# Patient Record
Sex: Female | Born: 1990 | Race: Black or African American | Hispanic: No | Marital: Single | Smoking: Current some day smoker
Health system: Southern US, Community
[De-identification: ages and names within clinical notes are randomized; demographics above are authoritative.]

## PROBLEM LIST (undated history)

## (undated) DIAGNOSIS — R109 Unspecified abdominal pain: Secondary | ICD-10-CM

## (undated) DIAGNOSIS — Z765 Malingerer [conscious simulation]: Secondary | ICD-10-CM

## (undated) DIAGNOSIS — G8929 Other chronic pain: Secondary | ICD-10-CM

## (undated) DIAGNOSIS — J45909 Unspecified asthma, uncomplicated: Secondary | ICD-10-CM

## (undated) DIAGNOSIS — K8020 Calculus of gallbladder without cholecystitis without obstruction: Secondary | ICD-10-CM

---

## 2009-07-31 ENCOUNTER — Emergency Department (HOSPITAL_COMMUNITY): Admission: EM | Admit: 2009-07-31 | Discharge: 2009-07-31 | Payer: Self-pay | Admitting: Emergency Medicine

## 2010-06-16 ENCOUNTER — Emergency Department (HOSPITAL_COMMUNITY)
Admission: EM | Admit: 2010-06-16 | Discharge: 2010-06-16 | Disposition: A | Discharge status: Home / Self Care | Payer: Self-pay | Attending: Emergency Medicine | Admitting: Emergency Medicine

## 2010-06-16 DIAGNOSIS — J45901 Unspecified asthma with (acute) exacerbation: Secondary | ICD-10-CM | POA: Insufficient documentation

## 2010-06-16 DIAGNOSIS — J209 Acute bronchitis, unspecified: Secondary | ICD-10-CM | POA: Insufficient documentation

## 2011-10-18 ENCOUNTER — Encounter (HOSPITAL_COMMUNITY): Payer: Self-pay | Admitting: *Deleted

## 2011-10-18 ENCOUNTER — Emergency Department (HOSPITAL_COMMUNITY): Payer: Self-pay

## 2011-10-18 ENCOUNTER — Emergency Department (HOSPITAL_COMMUNITY)
Admission: EM | Admit: 2011-10-18 | Discharge: 2011-10-18 | Disposition: A | Discharge status: Home / Self Care | Payer: Self-pay | Attending: Emergency Medicine | Admitting: Emergency Medicine

## 2011-10-18 DIAGNOSIS — J45909 Unspecified asthma, uncomplicated: Secondary | ICD-10-CM | POA: Insufficient documentation

## 2011-10-18 DIAGNOSIS — F17200 Nicotine dependence, unspecified, uncomplicated: Secondary | ICD-10-CM | POA: Insufficient documentation

## 2011-10-18 DIAGNOSIS — L02412 Cutaneous abscess of left axilla: Secondary | ICD-10-CM

## 2011-10-18 DIAGNOSIS — IMO0002 Reserved for concepts with insufficient information to code with codable children: Secondary | ICD-10-CM | POA: Insufficient documentation

## 2011-10-18 DIAGNOSIS — J45901 Unspecified asthma with (acute) exacerbation: Secondary | ICD-10-CM

## 2011-10-18 HISTORY — DX: Unspecified asthma, uncomplicated: J45.909

## 2011-10-18 MED ORDER — SODIUM CHLORIDE 0.9 % IV SOLN: INTRAVENOUS | Status: DC

## 2011-10-18 MED ORDER — METHYLPREDNISOLONE SODIUM SUCC 125 MG IJ SOLR
INTRAMUSCULAR | Status: AC
  Administered 2011-10-18: 125 mg via INTRAVENOUS
  Filled 2011-10-18: qty 2

## 2011-10-18 MED ORDER — ALBUTEROL SULFATE (5 MG/ML) 0.5% IN NEBU
2.5000 mg | INHALATION_SOLUTION | Freq: Once | RESPIRATORY_TRACT | Status: AC
  Administered 2011-10-18: 2.5 mg via RESPIRATORY_TRACT
  Filled 2011-10-18: qty 0.5

## 2011-10-18 MED ORDER — ALBUTEROL SULFATE HFA 108 (90 BASE) MCG/ACT IN AERS: 1.0000 | INHALATION_SPRAY | Freq: Four times a day (QID) | RESPIRATORY_TRACT | Status: DC | PRN

## 2011-10-18 MED ORDER — IPRATROPIUM BROMIDE 0.02 % IN SOLN
0.5000 mg | Freq: Once | RESPIRATORY_TRACT | Status: AC
  Administered 2011-10-18: 0.5 mg via RESPIRATORY_TRACT
  Filled 2011-10-18: qty 2.5

## 2011-10-18 MED ORDER — HYDROCODONE-ACETAMINOPHEN 5-325 MG PO TABS: 1.0000 | ORAL_TABLET | Freq: Four times a day (QID) | ORAL | Status: AC | PRN

## 2011-10-18 MED ORDER — IPRATROPIUM BROMIDE 0.02 % IN SOLN
RESPIRATORY_TRACT | Status: AC
  Filled 2011-10-18: qty 2.5

## 2011-10-18 MED ORDER — DOXYCYCLINE HYCLATE 100 MG PO CAPS: 100.0000 mg | ORAL_CAPSULE | Freq: Two times a day (BID) | ORAL | Status: AC

## 2011-10-18 MED ORDER — PREDNISONE 10 MG PO TABS: 40.0000 mg | ORAL_TABLET | Freq: Every day | ORAL | Status: DC

## 2011-10-18 NOTE — ED Notes (Signed)
Pt removed her own IV placed by EMS, catheter intact on inspection.

## 2011-10-18 NOTE — Discharge Instructions (Signed)
Asthma Prevention  Cigarette smoke, house dust, molds, pollens, animal dander, certain insects, exercise, and even cold air are all triggers that can cause an asthma attack. Often, no specific triggers are identified.   Take the following measures around your house to reduce attacks:   Avoid cigarette and other smoke. No smoking should be allowed in a home where someone with asthma lives. If smoking is allowed indoors, it should be done in a room with a closed door, and a window should be opened to clear the air. If possible, do not use a wood-burning stove, kerosene heater, or fireplace. Minimize exposure to all sources of smoke, including incense, candles, fires, and fireworks.   Decrease pollen exposure. Keep your windows shut and use central air during the pollen allergy season. Stay indoors with windows closed from late morning to afternoon, if you can. Avoid mowing the lawn if you have grass pollen allergy. Change your clothes and shower after being outside during this time of year.   Remove molds from bathrooms and wet areas. Do this by cleaning the floors with a fungicide or diluted bleach. Avoid using humidifiers, vaporizers, or swamp coolers. These can spread molds through the air. Fix leaky faucets, pipes, or other sources of water that have mold around them.   Decrease house dust exposure. Do this by using bare floors, vacuuming frequently, and changing furnace and air cooler filters frequently. Avoid using feather, wool, or foam bedding. Use polyester pillows and plastic covers over your mattress. Wash bedding weekly in hot water (hotter than 130 F).   Try to get someone else to vacuum for you once or twice a week, if you can. Stay out of rooms while they are being vacuumed and for a short while afterward. If you vacuum, use a dust mask (from a hardware store), a double-layered or microfilter vacuum cleaner bag, or a vacuum cleaner with a HEPA filter.   Avoid perfumes, talcum powder, hair spray,  paints and other strong odors and fumes.   Keep warm-blooded pets (cats, dogs, rodents, birds) outside the home if they are triggers for asthma. If you can't keep the pet outdoors, keep the pet out of your bedroom and other sleeping areas at all times, and keep the door closed. Remove carpets and furniture covered with cloth from your home. If that is not possible, keep the pet away from fabric-covered furniture and carpets.   Eliminate cockroaches. Keep food and garbage in closed containers. Never leave food out. Use poison baits, traps, powders, gels, or paste (for example, boric acid). If a spray is used to kill cockroaches, stay out of the room until the odor goes away.   Decrease indoor humidity to less than 60%. Use an indoor air cleaning device.   Avoid sulfites in foods and beverages. Do not drink beer or wine or eat dried fruit, processed potatoes, or shrimp if they cause asthma symptoms.   Avoid cold air. Cover your nose and mouth with a scarf on cold or windy days.   Avoid aspirin. This is the most common drug causing serious asthma attacks.   If exercise triggers your asthma, ask your caregiver how you should prepare before exercising. (For example, ask if you could use your inhaler 10 minutes before exercising.)   Avoid close contact with people who have a cold or the flu since your asthma symptoms may get worse if you catch the infection from them. Wash your hands thoroughly after touching items that may have been handled by   respiratory infection.   Get a flu shot every year to protect against the flu virus, which often makes asthma worse for days to weeks. Also get a pneumonia shot once every five to 10 years.  Call your caregiver if you want further information about measures you can take to help prevent asthma attacks. Document Released: 04/19/2005 Document Revised: 04/08/2011 Document Reviewed: 02/25/2009 [facility] Patient Information 2012 [place],  [place].  As far as the axillary abscess care does continue to express pus from it take antibiotic*return for new or worse symptoms. For your asthma usual albuterol inhaler 2 puffs every 6 hours at least for a week then as needed take prednisone as to record for the next 5 days.

## 2011-10-18 NOTE — ED Notes (Signed)
Discharge instructions reviewed with pt, questions answered. Pt verbalized understanding.  

## 2011-10-18 NOTE — ED Provider Notes (Addendum)
History  This chart was scribed for Mindy Jakes, MD by Bennett Scrape. This patient was seen in room APA19/APA19 and the patient's care was started at 3:32PM.  CSN: [id number]  Arrival date & time 10/18/11  1458   First MD Initiated Contact with Patient 10/18/11 1532      Chief Complaint  Patient presents with  . Shortness of Breath    Patient is a 21 y.o. female presenting with shortness of breath. The history is provided by the patient. No language interpreter was used.  Shortness of Breath  The current episode started 3 to 5 days ago. The onset was gradual. The problem occurs continuously. The problem has been gradually worsening. The symptoms are relieved by cold air. Nothing aggravates the symptoms. Associated symptoms include shortness of breath and wheezing. Pertinent negatives include no chest pain, no fever, no sore throat and no cough. Her past medical history is significant for asthma. There were no sick contacts. Recently, medical care has been given by EMS. Services received include medications given.     STEPHANE Clark is a 21 y.o. female with a h/o asthma brought in by ambulance, who presents to the Emergency Department complaining of 2 to 3 days of gradual onset, gradually worsening, constant SOB with associated wheezing that she attributes to an asthma attack. Pt was given Solumedrol 125mg  IV, albuterol 5mg  nebulizer and atrovent 0.5mg  nebulizer en route. She reports that the medications given en route improved her symptoms. She states that she ran out of her albuterol inhaler at home a few days ago. She denies having a recent cold or cough. She denies being on prednisone in the past 4 to 5 months. She also c/o 2 to 3 days of an abscess under the left axilla. The abscess is currently draining purulent discharge. She reports prior episodes in the same area. She denies having a prior I&D stating that the boils always drain on their own. She denies fever and nausea as  associated symptoms. Pt is a current everyday smoker but denies alcohol use.  Dr. Jorene Guest is PCP.  Past Medical History  Diagnosis Date  . Asthma     History reviewed. No pertinent past surgical history.  No family history on file.  History  Substance Use Topics  . Smoking status: Current Everyday Smoker    Types: Cigarettes  . Smokeless tobacco: Not on file  . Alcohol Use: No     Review of Systems  Constitutional: Negative for fever.  HENT: Negative for congestion, sore throat and neck pain.   Eyes: Negative for discharge and redness.  Respiratory: Positive for shortness of breath and wheezing. Negative for cough.   Cardiovascular: Negative for chest pain.  Gastrointestinal: Negative for vomiting, abdominal pain and diarrhea.  Genitourinary: Negative for dysuria.  Musculoskeletal: Negative for back pain.  Skin: Negative for rash.  Neurological: Negative for dizziness, syncope and headaches.  Psychiatric/Behavioral: Negative for suicidal ideas, hallucinations and confusion.    Allergies  Kiwi extract  Home Medications   Current Outpatient Rx  Name Route Sig Dispense Refill  . ACETAMINOPHEN 500 MG PO TABS Oral Take 1,000 mg by mouth as needed. For pain    . ALBUTEROL SULFATE HFA 108 (90 BASE) MCG/ACT IN AERS Inhalation Inhale 1-2 puffs into the lungs every 6 (six) hours as needed for wheezing. 1 Inhaler 0  . DOXYCYCLINE HYCLATE 100 MG PO CAPS Oral Take 1 capsule (100 mg total) by mouth 2 (two) times daily. 14 capsule 0  .  HYDROCODONE-ACETAMINOPHEN 5-325 MG PO TABS Oral Take 1-2 tablets by mouth every 6 (six) hours as needed for pain. 10 tablet 0  . PREDNISONE 10 MG PO TABS Oral Take 4 tablets (40 mg total) by mouth daily. 20 tablet 0    Triage Vitals: BP 111/55  Pulse 121  Temp 98.1 F (36.7 C) (Oral)  Resp 20  Ht 4\' 11"  (1.499 m)  SpO2 97%  LMP 10/02/2011  Physical Exam  Nursing note and vitals reviewed. Constitutional: She is oriented to person, place,  and time. She appears well-developed and well-nourished. No distress.  HENT:  Head: Normocephalic and atraumatic.  Eyes: EOM are normal.  Neck: Neck supple. No tracheal deviation present.  Cardiovascular: Normal rate and regular rhythm.   No murmur heard. Pulmonary/Chest: Effort normal. No respiratory distress. She has wheezes (bilateral wheezing).  Abdominal: Soft. Bowel sounds are normal. There is no tenderness.  Musculoskeletal: Normal range of motion.  Neurological: She is alert and oriented to person, place, and time. No cranial nerve deficit.  Skin: Skin is warm and dry.       3 cm area of induration under the left armpit that is draining copious amounts of pus  Psychiatric: She has a normal mood and affect. Her behavior is normal.    ED Course  Procedures (including critical care time)  DIAGNOSTIC STUDIES: Oxygen Saturation is 97% on room air, adequate by my interpretation.    COORDINATION OF CARE: 4:06PM-Pt reports that her breathing is improved after the treatment given by EMS. Discussed treatment plan with pt and pt agreed to plan.   Labs Reviewed - No data to display Dg Chest 2 View  10/18/2011  *RADIOLOGY REPORT*  Clinical Data: Shortness of breath.  Asthma.  CHEST - 2 VIEW  Comparison:  None.  Findings:  The heart size and mediastinal contours are within normal limits.  Both lungs are clear.  The visualized skeletal structures are unremarkable.  IMPRESSION: No active cardiopulmonary disease.  Original Report Authenticated By: Danae Orleans, M.D.    Date: 10/18/2011  Rate: 111  Rhythm: sinus tachycardia  QRS Axis: normal  Intervals: normal  ST/T Wave abnormalities: normal  Conduction Disutrbances:none  Narrative Interpretation:   Old EKG Reviewed: none available    1. Asthma attack   2. Abscess of axilla, left       MDM  Asthma improved significantly after 2 albuterol Atrovent nebulizers one given by the handle of service 1 given by [place]. Patient we  discharged home with albuterol inhaler 2 puffs every 6 hours for the next week then as needed and a course of prednisone 40 mg each day for 5 days. In addition the left axillary abscess is draining spontaneously copious amounts of purulent material material was expressed in the emergency department I&D not required patient will continue to express and massage this at home take antibiotic as directed return for new or worse symptoms. Will be given doxycycline to help clear up the infection. Patient discharge states that her breathing is sniffily improved and she feels much better.      I personally performed the services described in this documentation, which was scribed in my presence. The recorded information has been reviewed and considered.    on  Mindy Jakes, MD 10/18/11 1610  Mindy Jakes, MD 10/18/11 [phone]

## 2011-10-18 NOTE — ED Notes (Signed)
Reports felt asthma attack coming on while attempting to go to sleep.  EMS gave Solumedrol 125mg  IV, albuterol 5mg  neb, and atrovent 0.5mg  neb en route PTA.  Pt also reports abscess left axilla with fever for a few days.  Speaking clear, full sentences at this time.  No audible wheezing.

## 2012-03-21 ENCOUNTER — Emergency Department (HOSPITAL_COMMUNITY)
Admission: EM | Admit: 2012-03-21 | Discharge: 2012-03-21 | Disposition: A | Discharge status: Home / Self Care | Payer: Medicare Other | Attending: Emergency Medicine | Admitting: Emergency Medicine

## 2012-03-21 ENCOUNTER — Encounter (HOSPITAL_COMMUNITY): Payer: Self-pay

## 2012-03-21 DIAGNOSIS — L259 Unspecified contact dermatitis, unspecified cause: Secondary | ICD-10-CM

## 2012-03-21 DIAGNOSIS — F17200 Nicotine dependence, unspecified, uncomplicated: Secondary | ICD-10-CM | POA: Insufficient documentation

## 2012-03-21 DIAGNOSIS — IMO0002 Reserved for concepts with insufficient information to code with codable children: Secondary | ICD-10-CM | POA: Insufficient documentation

## 2012-03-21 DIAGNOSIS — J45901 Unspecified asthma with (acute) exacerbation: Secondary | ICD-10-CM

## 2012-03-21 DIAGNOSIS — L02412 Cutaneous abscess of left axilla: Secondary | ICD-10-CM

## 2012-03-21 DIAGNOSIS — J45909 Unspecified asthma, uncomplicated: Secondary | ICD-10-CM | POA: Insufficient documentation

## 2012-03-21 MED ORDER — ALBUTEROL SULFATE HFA 108 (90 BASE) MCG/ACT IN AERS
2.0000 | INHALATION_SPRAY | Freq: Once | RESPIRATORY_TRACT | Status: AC
  Administered 2012-03-21: 2 via RESPIRATORY_TRACT
  Filled 2012-03-21: qty 6.7

## 2012-03-21 MED ORDER — PREDNISONE 10 MG PO TABS: 20.0000 mg | ORAL_TABLET | Freq: Two times a day (BID) | ORAL | Status: DC

## 2012-03-21 MED ORDER — HYDROCODONE-ACETAMINOPHEN 5-500 MG PO TABS: 1.0000 | ORAL_TABLET | Freq: Four times a day (QID) | ORAL | Status: DC | PRN

## 2012-03-21 NOTE — ED Provider Notes (Signed)
History  This chart was scribed for Geoffery Lyons, MD by Manuela Schwartz, ED scribe. This patient was seen in room APA03/APA03 and the patient's care was started at 1311.   CSN: [id number]  Arrival date & time 03/21/12  1311   First MD Initiated Contact with Patient 03/21/12 1338      Chief Complaint  Patient presents with  . Asthma   HPI Mindy Clark is a 21 y.o. female who presents to the Emergency Department complaining of exacerbation of her asthma after she woke up this AM coughing and wheezing. She states she recently ran out of her inhaler medicine but is looking to get a refill soon. She states was breathing into a paper bag to try and improve her sx without relief.  She also complains of a a diffuse red, itchy skin rash over her arms and torso since 4 days ago. She also states has skin infections under her axilla and abdominal growth but is scheduled for a dermatological procedure for this problem. She states some subjective fever.   Past Medical History  Diagnosis Date  . Asthma     History reviewed. No pertinent past surgical history.  No family history on file.  History  Substance Use Topics  . Smoking status: Current Every Day Smoker    Types: Cigarettes  . Smokeless tobacco: Not on file  . Alcohol Use: No    OB History    Grav Para Term Preterm Abortions TAB SAB Ect Mult Living                  Review of Systems A complete 10 system review of systems was obtained and all systems are negative except as noted in the HPI and PMH.   Allergies  Kiwi extract; Amoxicillin; and Penicillins  Home Medications   Current Outpatient Rx  Name  Route  Sig  Dispense  Refill  . ACETAMINOPHEN 500 MG PO TABS   Oral   Take 1,000 mg by mouth as needed. For pain         . ALBUTEROL SULFATE HFA 108 (90 BASE) MCG/ACT IN AERS   Inhalation   Inhale 1-2 puffs into the lungs every 6 (six) hours as needed for wheezing.   1 Inhaler   0   . FLUTICASONE-SALMETEROL 100-50  MCG/DOSE IN AEPB   Inhalation   Inhale 1 puff into the lungs every 12 (twelve) hours.         Marland Kitchen HYDROCODONE-ACETAMINOPHEN 5-500 MG PO TABS   Oral   Take 1-2 tablets by mouth every 6 (six) hours as needed for pain.   15 tablet   0   . PREDNISONE 10 MG PO TABS   Oral   Take 2 tablets (20 mg total) by mouth 2 (two) times daily.   20 tablet   0     Triage Vitals: BP 114/67  Pulse 108  Temp 98.2 F (36.8 C) (Oral)  Resp 20  Ht 4\' 10"  (1.473 m)  Wt 293 lb (132.904 kg)  BMI 61.24 kg/m2  SpO2 98%  LMP 03/05/2012  Physical Exam  Nursing note and vitals reviewed. Constitutional: She is oriented to person, place, and time. She appears well-developed and well-nourished. No distress.  HENT:  Head: Normocephalic and atraumatic.  Eyes: EOM are normal.  Neck: Neck supple. No tracheal deviation present.  Cardiovascular: Normal rate, regular rhythm and normal heart sounds.   No murmur heard. Pulmonary/Chest: Effort normal. No respiratory distress.  Scattered expiratory wheezes bilaterally   Musculoskeletal: Normal range of motion.  Neurological: She is alert and oriented to person, place, and time.  Skin: Skin is warm and dry.       Macular rash present on arms, legs, and torso. Under left axilla firm 1 cm fluctuant lesion with mild erythema overlying it.   Psychiatric: She has a normal mood and affect. Her behavior is normal.    ED Course  Procedures (including critical care time) DIAGNOSTIC STUDIES: Oxygen Saturation is 98% on room air, normal by my interpretation.    COORDINATION OF CARE: At 200 PM Discussed treatment plan with patient which includes proventil, prednisone. Patient agrees.   Labs Reviewed - No data to display No results found.   1. Exacerbation of RAD (reactive airway disease)   2. Contact dermatitis   3. Abscess of left axilla       MDM  Feels better with nebs by ems.  She has pain from an abscess under her axilla that she says they are  doing a "skin graft" on in the near future.  Will treat her with a few pain meds.       I personally performed the services described in this documentation, which was scribed in my presence. The recorded information has been reviewed and is accurate.           Geoffery Lyons, MD 03/21/12 2029

## 2012-03-21 NOTE — ED Notes (Signed)
Pt reports woke up this morning coughing and became SOB.    Reports cough was productive with yellow sputum.  PT says was out of her neb and inhaler.  EMS administered 2 2.5mg  albuterol treatments and 125mg  solumedrol.   Pt has 20g IV in r hand.  Pt says is breathing much easier.  PT says a few days ago broke out in red itchy rash.  Has been applying diaper rash ointment.

## 2012-03-21 NOTE — ED Notes (Signed)
Paged RT for inhaler 

## 2012-04-18 ENCOUNTER — Emergency Department (HOSPITAL_COMMUNITY)
Admission: EM | Admit: 2012-04-18 | Discharge: 2012-04-18 | Disposition: A | Discharge status: Home / Self Care | Payer: Medicare Other | Attending: Emergency Medicine | Admitting: Emergency Medicine

## 2012-04-18 ENCOUNTER — Encounter (HOSPITAL_COMMUNITY): Payer: Self-pay

## 2012-04-18 ENCOUNTER — Emergency Department (HOSPITAL_COMMUNITY): Payer: Medicare Other

## 2012-04-18 DIAGNOSIS — L732 Hidradenitis suppurativa: Secondary | ICD-10-CM | POA: Insufficient documentation

## 2012-04-18 DIAGNOSIS — J45901 Unspecified asthma with (acute) exacerbation: Secondary | ICD-10-CM | POA: Insufficient documentation

## 2012-04-18 DIAGNOSIS — F17200 Nicotine dependence, unspecified, uncomplicated: Secondary | ICD-10-CM | POA: Insufficient documentation

## 2012-04-18 DIAGNOSIS — Z79899 Other long term (current) drug therapy: Secondary | ICD-10-CM | POA: Insufficient documentation

## 2012-04-18 MED ORDER — PREDNISONE 50 MG PO TABS
60.0000 mg | ORAL_TABLET | Freq: Once | ORAL | Status: AC
  Administered 2012-04-18: 60 mg via ORAL
  Filled 2012-04-18: qty 1

## 2012-04-18 MED ORDER — HYDROCODONE-ACETAMINOPHEN 5-325 MG PO TABS: 1.0000 | ORAL_TABLET | ORAL | Status: AC | PRN

## 2012-04-18 MED ORDER — PREDNISONE 20 MG PO TABS: ORAL_TABLET | ORAL | Status: DC

## 2012-04-18 MED ORDER — HYDROCODONE-ACETAMINOPHEN 5-325 MG PO TABS: 1.0000 | ORAL_TABLET | ORAL | Status: DC | PRN

## 2012-04-18 MED ORDER — ALBUTEROL SULFATE HFA 108 (90 BASE) MCG/ACT IN AERS
2.0000 | INHALATION_SPRAY | RESPIRATORY_TRACT | Status: DC | PRN
  Administered 2012-04-18: 2 via RESPIRATORY_TRACT
  Filled 2012-04-18: qty 6.7

## 2012-04-18 NOTE — ED Notes (Signed)
MD at bedside. 

## 2012-04-18 NOTE — ED Provider Notes (Addendum)
History   This chart was scribed for Mindy Cooper III, MD by Sofie Rower, ED Scribe. The patient was seen in room APAH4/APAH4 and the patient's care was started at 3:10PM.    CSN: [id number]  Arrival date & time 04/18/12  1414   First MD Initiated Contact with Patient 04/18/12 1510      Chief Complaint  Patient presents with  . Wheezing    (Consider location/radiation/quality/duration/timing/severity/associated sxs/prior treatment) The history is provided by the patient. No language interpreter was used.    Mindy Clark is a 21 y.o. female , with a hx of asthma, who presents to the Emergency Department complaining of sudden, progressively worsening, wheezing, onset today (04/18/12). The pt reports that she believes the recent change of weather may have triggered her asthmatic symptoms, however, she does not have an albuterol inhaler to manage her symptoms at present time.  The pt is a current everyday smoker, however, he does not drink alcohol.      Past Medical History  Diagnosis Date  . Asthma     History reviewed. No pertinent past surgical history.  No family history on file.  History  Substance Use Topics  . Smoking status: Current Every Day Smoker    Types: Cigarettes  . Smokeless tobacco: Not on file  . Alcohol Use: No    OB History    Grav Para Term Preterm Abortions TAB SAB Ect Mult Living                  Review of Systems  Respiratory: Positive for wheezing.   All other systems reviewed and are negative.    Allergies  Kiwi extract; Amoxicillin; and Penicillins  Home Medications   Current Outpatient Rx  Name  Route  Sig  Dispense  Refill  . ACETAMINOPHEN 500 MG PO TABS   Oral   Take 1,000 mg by mouth as needed. For pain         . ALBUTEROL SULFATE HFA 108 (90 BASE) MCG/ACT IN AERS   Inhalation   Inhale 1-2 puffs into the lungs every 6 (six) hours as needed for wheezing.   1 Inhaler   0   . FLUTICASONE-SALMETEROL 100-50 MCG/DOSE IN  AEPB   Inhalation   Inhale 1 puff into the lungs every 12 (twelve) hours.         Marland Kitchen HYDROCODONE-ACETAMINOPHEN 5-500 MG PO TABS   Oral   Take 1-2 tablets by mouth every 6 (six) hours as needed for pain.   15 tablet   0   . PREDNISONE 10 MG PO TABS   Oral   Take 2 tablets (20 mg total) by mouth 2 (two) times daily.   20 tablet   0     BP 104/60  Pulse 96  Temp 98.2 F (36.8 C) (Oral)  Resp 20  Ht 5' (1.524 m)  Wt 300 lb (136.079 kg)  BMI 58.59 kg/m2  SpO2 97%  LMP 04/04/2012  Physical Exam  Nursing note and vitals reviewed. Constitutional: She is oriented to person, place, and time. She appears well-developed and well-nourished. No distress.       Morbidly obese.   HENT:  Head: Normocephalic and atraumatic.  Eyes: EOM are normal. Pupils are equal, round, and reactive to light.  Neck: Neck supple. No tracheal deviation present.  Cardiovascular: Normal rate, regular rhythm and normal heart sounds.   Pulmonary/Chest: Effort normal and breath sounds normal. No respiratory distress.  Abdominal: Soft. Bowel sounds are normal. She  exhibits no distension.  Musculoskeletal: Normal range of motion. She exhibits no edema.       Has sinus tracts in her axillae from hidradenitis suppurativa.  Neurological: She is alert and oriented to person, place, and time. No sensory deficit.  Skin: Skin is warm and dry.  Psychiatric: She has a normal mood and affect. Her behavior is normal.    ED Course  Procedures (including critical care time)  DIAGNOSTIC STUDIES: Oxygen Saturation is 97% on room air, normal by my interpretation.    COORDINATION OF CARE:  3:35 PM- Treatment plan discussed with patient. Pt agrees with treatment.    4:05 PM Asthma attack had subsided after one albuterol nebulizer treatment.  Rx albuterol inhaler to go, prednisone taper, hydrocodone-acetaminophen prn for the pain of her hidradenitis suppurativa.      1. Asthma attack   2. Axillary hidradenitis  suppurativa     I personally performed the services described in this documentation, which was scribed in my presence. The recorded information has been reviewed and is accurate. Osvaldo Human, MD      Mindy Cooper III, MD 04/18/12 1610     Mindy Cooper III, MD 04/18/12 [phone]

## 2012-04-18 NOTE — ED Notes (Signed)
EDP Ignacia Palma made aware of pt in room

## 2012-04-18 NOTE — ED Notes (Signed)
Pt states she woke this morning with wheezing. EMS gave 1 neb treatment with relief and patient now feels better. States that she can not afford her inhalers.

## 2012-11-02 ENCOUNTER — Encounter: Payer: Self-pay | Admitting: Adult Health

## 2012-11-06 ENCOUNTER — Encounter: Payer: Self-pay | Admitting: Adult Health

## 2013-03-31 ENCOUNTER — Encounter (HOSPITAL_COMMUNITY): Payer: Self-pay | Admitting: Emergency Medicine

## 2013-03-31 ENCOUNTER — Emergency Department (HOSPITAL_COMMUNITY)
Admission: EM | Admit: 2013-03-31 | Discharge: 2013-03-31 | Disposition: A | Discharge status: Home / Self Care | Payer: Medicare Other | Attending: Emergency Medicine | Admitting: Emergency Medicine

## 2013-03-31 DIAGNOSIS — J039 Acute tonsillitis, unspecified: Secondary | ICD-10-CM

## 2013-03-31 DIAGNOSIS — E669 Obesity, unspecified: Secondary | ICD-10-CM | POA: Insufficient documentation

## 2013-03-31 DIAGNOSIS — Z79899 Other long term (current) drug therapy: Secondary | ICD-10-CM | POA: Insufficient documentation

## 2013-03-31 DIAGNOSIS — F17200 Nicotine dependence, unspecified, uncomplicated: Secondary | ICD-10-CM | POA: Insufficient documentation

## 2013-03-31 DIAGNOSIS — J45909 Unspecified asthma, uncomplicated: Secondary | ICD-10-CM | POA: Insufficient documentation

## 2013-03-31 MED ORDER — AZITHROMYCIN 250 MG PO TABS
500.0000 mg | ORAL_TABLET | Freq: Once | ORAL | Status: AC
  Administered 2013-03-31: 500 mg via ORAL
  Filled 2013-03-31: qty 2

## 2013-03-31 MED ORDER — MAGIC MOUTHWASH W/LIDOCAINE: ORAL | Status: DC

## 2013-03-31 MED ORDER — AZITHROMYCIN 250 MG PO TABS: ORAL_TABLET | ORAL | Status: DC

## 2013-03-31 NOTE — ED Notes (Signed)
Patient states that she has strep throat. Throat started bothering me 2 days ago per pt.

## 2013-04-02 NOTE — ED Provider Notes (Signed)
CSN: [id number]     Arrival date & time 03/31/13  1913 History   First MD Initiated Contact with Patient 03/31/13 1941     Chief Complaint  Patient presents with  . Sore Throat   (Consider location/radiation/quality/duration/timing/severity/associated sxs/prior Treatment) Patient is a 22 y.o. female presenting with pharyngitis. The history is provided by the patient.  Sore Throat This is a new problem. Episode onset: 2 days ago. The problem occurs constantly. The problem has been gradually worsening. Associated symptoms include a sore throat and swollen glands. Pertinent negatives include no abdominal pain, arthralgias, change in bowel habit, chest pain, chills, congestion, coughing, fever, headaches, nausea, neck pain, numbness, rash, vertigo, vomiting or weakness. The symptoms are aggravated by swallowing. She has tried nothing for the symptoms. The treatment provided no relief.    Past Medical History  Diagnosis Date  . Asthma    History reviewed. No pertinent past surgical history. No family history on file. History  Substance Use Topics  . Smoking status: Current Every Day Smoker    Types: Cigarettes  . Smokeless tobacco: Not on file  . Alcohol Use: No   OB History   Grav Para Term Preterm Abortions TAB SAB Ect Mult Living                 Review of Systems  Constitutional: Negative for fever, chills, activity change and appetite change.  HENT: Positive for sore throat. Negative for congestion, ear pain, facial swelling, trouble swallowing and voice change.   Eyes: Negative for pain and visual disturbance.  Respiratory: Negative for cough and shortness of breath.   Cardiovascular: Negative for chest pain.  Gastrointestinal: Negative for nausea, vomiting, abdominal pain, abdominal distention and change in bowel habit.  Musculoskeletal: Negative for arthralgias, neck pain and neck stiffness.  Skin: Negative for color change and rash.  Neurological: Negative for dizziness,  vertigo, facial asymmetry, speech difficulty, weakness, numbness and headaches.  Hematological: Negative for adenopathy.  All other systems reviewed and are negative.    Allergies  Kiwi extract; Amoxicillin; and Penicillins  Home Medications   Current Outpatient Rx  Name  Route  Sig  Dispense  Refill  . albuterol (PROVENTIL) (2.5 MG/3ML) 0.083% nebulizer solution   Nebulization   Take 2.5 mg by nebulization every 4 (four) hours as needed for wheezing or shortness of breath.          . EXPIRED: albuterol (PROVENTIL HFA;VENTOLIN HFA) 108 (90 BASE) MCG/ACT inhaler   Inhalation   Inhale 1-2 puffs into the lungs every 6 (six) hours as needed for wheezing.   1 Inhaler   0   . Alum & Mag Hydroxide-Simeth (MAGIC MOUTHWASH W/LIDOCAINE) SOLN      5 ml po swish and spit TID, do not swallow   100 mL   0   . azithromycin (ZITHROMAX Z-PAK) 250 MG tablet      Take two tablets on day one, then one tab qd days 2-5   6 tablet   0   . ibuprofen (ADVIL,MOTRIN) 200 MG tablet   Oral   Take 600 mg by mouth daily.          BP 129/75  Temp(Src) 98.8 F (37.1 C) (Oral)  Resp 16  Ht 4\' 11"  (1.499 m)  Wt 276 lb (125.193 kg)  BMI 55.72 kg/m2  SpO2 98%  LMP 03/03/2013 Physical Exam  Nursing note and vitals reviewed. Constitutional: She is oriented to person, place, and time. She appears well-developed and well-nourished. No  distress.  Pt is obese  HENT:  Head: Normocephalic and atraumatic.  Right Ear: Tympanic membrane and ear canal normal.  Left Ear: Tympanic membrane and ear canal normal.  Mouth/Throat: Uvula is midline and mucous membranes are normal. No trismus in the jaw. No uvula swelling. Oropharyngeal exudate, posterior oropharyngeal edema and posterior oropharyngeal erythema present. No tonsillar abscesses.  Moderate exudate present bilateral tonsils.  No PTA.  Uvula midline.  Pt handles secretions well.  Neck: Normal range of motion. Neck supple. No thyromegaly present.   Cardiovascular: Normal rate, regular rhythm, normal heart sounds and intact distal pulses.   No murmur heard. Pulmonary/Chest: Effort normal and breath sounds normal. No respiratory distress. She has no wheezes. She has no rales.  Abdominal: Soft. Normal appearance. There is no hepatosplenomegaly or splenomegaly. There is no tenderness. There is no rebound and no guarding.  Musculoskeletal: Normal range of motion.  Lymphadenopathy:    She has cervical adenopathy.       Right cervical: Superficial cervical adenopathy present.       Left cervical: Superficial cervical adenopathy present.  Neurological: She is alert and oriented to person, place, and time. She exhibits normal muscle tone. Coordination normal.  Skin: Skin is warm and dry.    ED Course  Procedures (including critical care time) Labs Review Labs Reviewed - No data to display Imaging Review No results found.  EKG Interpretation   None       MDM   1. Tonsillitis    Pt is non-toxic appearing.  Handles own secretions well.  Uvula midline w/o PTA.  Will treat with zithromax and magic mouthwash.  Pt agrees to ibuprofen and fluids and close PMD f/u.  VSS.  Pt appears stable for discharge.   Mckenzee Beem L. Trisha Mangle, PA-C 04/02/13 1654

## 2013-04-04 NOTE — ED Provider Notes (Signed)
Medical screening examination/treatment/procedure(s) were performed by non-physician practitioner and as supervising physician I was immediately available for consultation/collaboration.  EKG Interpretation   None        Jerick Khachatryan R. Branda Chaudhary, MD 04/04/13 1521 

## 2013-05-01 ENCOUNTER — Emergency Department (HOSPITAL_COMMUNITY)
Admission: EM | Admit: 2013-05-01 | Discharge: 2013-05-01 | Disposition: A | Discharge status: Home / Self Care | Payer: Medicare Other | Attending: Emergency Medicine | Admitting: Emergency Medicine

## 2013-05-01 ENCOUNTER — Encounter (HOSPITAL_COMMUNITY): Payer: Self-pay | Admitting: Emergency Medicine

## 2013-05-01 DIAGNOSIS — Z880 Allergy status to penicillin: Secondary | ICD-10-CM | POA: Insufficient documentation

## 2013-05-01 DIAGNOSIS — F17200 Nicotine dependence, unspecified, uncomplicated: Secondary | ICD-10-CM | POA: Insufficient documentation

## 2013-05-01 DIAGNOSIS — J039 Acute tonsillitis, unspecified: Secondary | ICD-10-CM

## 2013-05-01 DIAGNOSIS — J45909 Unspecified asthma, uncomplicated: Secondary | ICD-10-CM

## 2013-05-01 DIAGNOSIS — J45901 Unspecified asthma with (acute) exacerbation: Secondary | ICD-10-CM | POA: Insufficient documentation

## 2013-05-01 DIAGNOSIS — Z79899 Other long term (current) drug therapy: Secondary | ICD-10-CM | POA: Insufficient documentation

## 2013-05-01 MED ORDER — IPRATROPIUM BROMIDE 0.02 % IN SOLN
0.5000 mg | Freq: Once | RESPIRATORY_TRACT | Status: AC
  Administered 2013-05-01: 0.5 mg via RESPIRATORY_TRACT
  Filled 2013-05-01: qty 2.5

## 2013-05-01 MED ORDER — AZITHROMYCIN 250 MG PO TABS: ORAL_TABLET | ORAL | Status: DC

## 2013-05-01 MED ORDER — ALBUTEROL SULFATE (2.5 MG/3ML) 0.083% IN NEBU
5.0000 mg | INHALATION_SOLUTION | Freq: Once | RESPIRATORY_TRACT | Status: AC
  Administered 2013-05-01: 5 mg via RESPIRATORY_TRACT
  Filled 2013-05-01: qty 6

## 2013-05-01 MED ORDER — FLUTICASONE-SALMETEROL 100-50 MCG/DOSE IN AEPB: 1.0000 | INHALATION_SPRAY | Freq: Two times a day (BID) | RESPIRATORY_TRACT | Status: DC

## 2013-05-01 MED ORDER — HYDROCODONE-ACETAMINOPHEN 5-325 MG PO TABS: 1.0000 | ORAL_TABLET | ORAL | Status: DC | PRN

## 2013-05-01 MED ORDER — AZITHROMYCIN 250 MG PO TABS
500.0000 mg | ORAL_TABLET | Freq: Once | ORAL | Status: AC
  Administered 2013-05-01: 500 mg via ORAL
  Filled 2013-05-01: qty 2

## 2013-05-01 NOTE — ED Notes (Signed)
Seen x 1 month ago and dx with tonsillitis. Reports woke up yesterday with sore throat.  Denies fever.

## 2013-05-01 NOTE — ED Provider Notes (Signed)
CSN: [id number]     Arrival date & time 05/01/13  1126 History   First MD Initiated Contact with Patient 05/01/13 1150     Chief Complaint  Patient presents with  . Sore Throat   (Consider location/radiation/quality/duration/timing/severity/associated sxs/prior Treatment) HPI Comments: Mindy Clark is a 22 y.o. Female presenting with complaint of sore throat.  Her symptoms started yesterday morning when she woke, has a history of episodic tonsillitis last treated for this one month ago.  She has pain which is constant, worse with swallowing and has noted white spots on her tonsils.  She denies fevers at this time.  She does have a history of asthma and uses albuterol nebs or hand-held pump, last use was yesterday evening.  She is actively wheezing, but states this is her baseline for her.  She did have a prescription for Advair in the past, but ran out of this medication months ago when she lost her PCP.  She denies nasal congestion or drainage, no sinus pain, no postnasal drip, chronic cough which is not worsened today.     The history is provided by the patient.    Past Medical History  Diagnosis Date  . Asthma    History reviewed. No pertinent past surgical history. No family history on file. History  Substance Use Topics  . Smoking status: Current Every Day Smoker    Types: Cigarettes  . Smokeless tobacco: Not on file  . Alcohol Use: No   OB History   Grav Para Term Preterm Abortions TAB SAB Ect Mult Living                 Review of Systems  Constitutional: Positive for fever. Negative for chills and fatigue.  HENT: Positive for sore throat. Negative for congestion, ear pain, rhinorrhea, sinus pressure, trouble swallowing and voice change.   Eyes: Negative for discharge.  Respiratory: Positive for cough and wheezing. Negative for shortness of breath and stridor.        She describes chronic mild shortness of breath with exertion only.  Cardiovascular: Negative for chest  pain.  Gastrointestinal: Negative for abdominal pain.  Genitourinary: Negative.     Allergies  Kiwi extract; Amoxicillin; and Penicillins  Home Medications   Current Outpatient Rx  Name  Route  Sig  Dispense  Refill  . albuterol (PROVENTIL HFA;VENTOLIN HFA) 108 (90 BASE) MCG/ACT inhaler   Inhalation   Inhale 1-2 puffs into the lungs every 6 (six) hours as needed for wheezing.   1 Inhaler   0   . albuterol (PROVENTIL) (2.5 MG/3ML) 0.083% nebulizer solution   Nebulization   Take 2.5 mg by nebulization every 4 (four) hours as needed for wheezing or shortness of breath.          Marland Kitchen HYDROcodone-acetaminophen (NORCO) 10-325 MG per tablet   Oral   Take 1 tablet by mouth every 6 (six) hours as needed for moderate pain.         Marland Kitchen azithromycin (ZITHROMAX) 250 MG tablet      1 tab by mouth daily for 4 days   4 tablet   0   . Fluticasone-Salmeterol (ADVAIR DISKUS) 100-50 MCG/DOSE AEPB   Inhalation   Inhale 1 puff into the lungs every 12 (twelve) hours.   60 each   0   . HYDROcodone-acetaminophen (NORCO/VICODIN) 5-325 MG per tablet   Oral   Take 1 tablet by mouth every 4 (four) hours as needed for moderate pain.   15 tablet  0    BP 133/77  Pulse 113  Temp(Src) 97.9 F (36.6 C) (Oral)  Resp 20  Wt 307 lb 8 oz (139.481 kg)  SpO2 99%  LMP 04/11/2013 Physical Exam  Constitutional: She is oriented to person, place, and time. She appears well-developed and well-nourished.  HENT:  Head: Normocephalic and atraumatic.  Right Ear: Tympanic membrane and ear canal normal.  Left Ear: Tympanic membrane and ear canal normal.  Nose: No mucosal edema or rhinorrhea.  Mouth/Throat: Uvula is midline and mucous membranes are normal. Oropharyngeal exudate, posterior oropharyngeal edema and posterior oropharyngeal erythema present. No tonsillar abscesses.  2+ bilateral tonsillar edema.  Uvula midline.  Eyes: Conjunctivae are normal.  Cardiovascular: Normal rate and normal heart  sounds.   Pulmonary/Chest: Effort normal. No respiratory distress. She has decreased breath sounds. She has wheezes. She has no rhonchi. She has no rales.  Diffuse expiratory wheeze, prolonged expirations.  No rhonchi.  Able to hear wheezing from across the room.  Abdominal: Soft. There is no tenderness.  Musculoskeletal: Normal range of motion.  Neurological: She is alert and oriented to person, place, and time.  Skin: Skin is warm and dry. No rash noted.  Psychiatric: She has a normal mood and affect.    ED Course  Procedures (including critical care time) Labs Review Labs Reviewed - No data to display Imaging Review No results found.  EKG Interpretation   None       MDM   1. Tonsillitis   2. Asthma    Patient was given an albuterol and Atrovent neb treatment while here with significant improvement in wheezing and patient's symptoms.  She is still had bilateral wheeze which was much less, better air movement.  Could no longer here patient wheezing without a stethoscope.  She felt much better.  She was prescribed Zithromax for her tonsillitis with her first dose given here.  Also prescribed hydrocodone.  Encouraged rest, increased fluids.  She was prescribed her Advair and advised to get this started today.  Also given referrals for obtaining primary medical care.  In the interim encouraged recheck here for any worsened symptoms.    Burgess Amor, PA-C 05/01/13 [phone]

## 2013-05-02 NOTE — ED Provider Notes (Signed)
Medical screening examination/treatment/procedure(s) were performed by non-physician practitioner and as supervising physician I was immediately available for consultation/collaboration.  EKG Interpretation   None        Shelda Jakes, MD 05/02/13 [phone]

## 2013-08-08 ENCOUNTER — Encounter (HOSPITAL_COMMUNITY): Payer: Self-pay | Admitting: Emergency Medicine

## 2013-08-08 ENCOUNTER — Emergency Department (HOSPITAL_COMMUNITY)
Admission: EM | Admit: 2013-08-08 | Discharge: 2013-08-08 | Disposition: A | Discharge status: Home / Self Care | Payer: Medicare Other | Attending: Emergency Medicine | Admitting: Emergency Medicine

## 2013-08-08 DIAGNOSIS — L0291 Cutaneous abscess, unspecified: Secondary | ICD-10-CM

## 2013-08-08 DIAGNOSIS — L03319 Cellulitis of trunk, unspecified: Secondary | ICD-10-CM

## 2013-08-08 DIAGNOSIS — Z792 Long term (current) use of antibiotics: Secondary | ICD-10-CM | POA: Insufficient documentation

## 2013-08-08 DIAGNOSIS — F17200 Nicotine dependence, unspecified, uncomplicated: Secondary | ICD-10-CM | POA: Insufficient documentation

## 2013-08-08 DIAGNOSIS — Z79899 Other long term (current) drug therapy: Secondary | ICD-10-CM | POA: Insufficient documentation

## 2013-08-08 DIAGNOSIS — Z880 Allergy status to penicillin: Secondary | ICD-10-CM | POA: Insufficient documentation

## 2013-08-08 DIAGNOSIS — L02219 Cutaneous abscess of trunk, unspecified: Secondary | ICD-10-CM | POA: Insufficient documentation

## 2013-08-08 DIAGNOSIS — IMO0002 Reserved for concepts with insufficient information to code with codable children: Secondary | ICD-10-CM | POA: Insufficient documentation

## 2013-08-08 DIAGNOSIS — J45901 Unspecified asthma with (acute) exacerbation: Secondary | ICD-10-CM | POA: Insufficient documentation

## 2013-08-08 LAB — CBG MONITORING, ED: Glucose-Capillary: 91 mg/dL (ref 70–99)

## 2013-08-08 MED ORDER — PREDNISONE 50 MG PO TABS
60.0000 mg | ORAL_TABLET | Freq: Once | ORAL | Status: AC
  Administered 2013-08-08: 03:00:00 60 mg via ORAL

## 2013-08-08 MED ORDER — PREDNISONE 20 MG PO TABS: 60.0000 mg | ORAL_TABLET | Freq: Every day | ORAL | Status: DC

## 2013-08-08 MED ORDER — ALBUTEROL SULFATE HFA 108 (90 BASE) MCG/ACT IN AERS: 1.0000 | INHALATION_SPRAY | Freq: Four times a day (QID) | RESPIRATORY_TRACT | Status: DC | PRN

## 2013-08-08 MED ORDER — SULFAMETHOXAZOLE-TMP DS 800-160 MG PO TABS
1.0000 | ORAL_TABLET | Freq: Once | ORAL | Status: AC
  Administered 2013-08-08: 1 via ORAL

## 2013-08-08 MED ORDER — SULFAMETHOXAZOLE-TRIMETHOPRIM 800-160 MG PO TABS
1.0000 | ORAL_TABLET | Freq: Two times a day (BID) | ORAL | Status: DC
Start: 2013-08-08 — End: 2013-08-12

## 2013-08-08 MED ORDER — ALBUTEROL SULFATE HFA 108 (90 BASE) MCG/ACT IN AERS
2.0000 | INHALATION_SPRAY | RESPIRATORY_TRACT | Status: DC | PRN
  Administered 2013-08-08: 2 via RESPIRATORY_TRACT

## 2013-08-08 NOTE — Discharge Instructions (Signed)
Asthma, Adult °Asthma is a recurring condition in which the airways tighten and narrow. Asthma can make it difficult to breathe. It can cause coughing, wheezing, and shortness of breath. Asthma episodes (also called asthma attacks) range from minor to life-threatening. Asthma cannot be cured, but medicines and lifestyle changes can help control it. °CAUSES °Asthma is believed to be caused by inherited (genetic) and environmental factors, but its exact cause is unknown. Asthma may be triggered by allergens, lung infections, or irritants in the air. Asthma triggers are different for each person. Common triggers include:  °· Animal dander. °· Dust mites. °· Cockroaches. °· Pollen from trees or grass. °· Mold. °· Smoke. °· Air pollutants such as dust, household cleaners, hair sprays, aerosol sprays, paint fumes, strong chemicals, or strong odors. °· Cold air, weather changes, and winds (which increase molds and pollens in the air). °· Strong emotional expressions such as crying or laughing hard. °· Stress. °· Certain medicines (such as aspirin) or types of drugs (such as beta-blockers). °· Sulfites in foods and drinks. Foods and drinks that may contain sulfites include dried fruit, potato chips, and sparkling grape juice. °· Infections or inflammatory conditions such as the flu, a cold, or an inflammation of the nasal membranes (rhinitis). °· Gastroesophageal reflux disease (GERD). °· Exercise or strenuous activity. °SYMPTOMS °Symptoms may occur immediately after asthma is triggered or many hours later. Symptoms include: °· Wheezing. °· Excessive nighttime or early morning coughing. °· Frequent or severe coughing with a common cold. °· Chest tightness. °· Shortness of breath. °DIAGNOSIS  °The diagnosis of asthma is made by a review of your medical history and a physical exam. Tests may also be performed. These may include: °· Lung function studies. These tests show how much air you breath in and out. °· Allergy  tests. °· Imaging tests such as X-rays. °TREATMENT  °Asthma cannot be cured, but it can usually be controlled. Treatment involves identifying and avoiding your asthma triggers. It also involves medicines. There are 2 classes of medicine used for asthma treatment:  °· Controller medicines. These prevent asthma symptoms from occurring. They are usually taken every day. °· Reliever or rescue medicines. These quickly relieve asthma symptoms. They are used as needed and provide short-term relief. °Your health care provider will help you create an asthma action plan. An asthma action plan is a written plan for managing and treating your asthma attacks. It includes a list of your asthma triggers and how they may be avoided. It also includes information on when medicines should be taken and when their dosage should be changed. An action plan may also involve the use of a device called a peak flow meter. A peak flow meter measures how well the lungs are working. It helps you monitor your condition. °HOME CARE INSTRUCTIONS  °· Take medicine as directed by your health care provider. Speak with your health care provider if you have questions about how or when to take the medicines. °· Use a peak flow meter as directed by your health care provider. Record and keep track of readings. °· Understand and use the action plan to help minimize or stop an asthma attack without needing to seek medical care. °· Control your home environment in the following ways to help prevent asthma attacks: °· Do not smoke. Avoid being exposed to secondhand smoke. °· Change your heating and air conditioning filter regularly. °· Limit your use of fireplaces and wood stoves. °· Get rid of pests (such as roaches and   mice) and their droppings.  Throw away plants if you see mold on them.  Clean your floors and dust regularly. Use unscented cleaning products.  Try to have someone else vacuum for you regularly. Stay out of rooms while they are being  vacuumed and for a short while afterward. If you vacuum, use a dust mask from a hardware store, a double-layered or microfilter vacuum cleaner bag, or a vacuum cleaner with a HEPA filter.  Replace carpet with wood, tile, or vinyl flooring. Carpet can trap dander and dust.  Use allergy-proof pillows, mattress covers, and box spring covers.  Wash bed sheets and blankets every week in hot water and dry them in a dryer.  Use blankets that are made of polyester or cotton.  Clean bathrooms and kitchens with bleach. If possible, have someone repaint the walls in these rooms with mold-resistant paint. Keep out of the rooms that are being cleaned and painted.  Wash hands frequently. SEEK MEDICAL CARE IF:   You have wheezing, shortness of breath, or a cough even if taking medicine to prevent attacks.  The colored mucus you cough up (sputum) is thicker than usual.  Your sputum changes from clear or white to yellow, green, gray, or bloody.  You have any problems that may be related to the medicines you are taking (such as a rash, itching, swelling, or trouble breathing).  You are using a reliever medicine more than 2 3 times per week.  Your peak flow is still at 50 79% of you personal best after following your action plan for 1 hour. SEEK IMMEDIATE MEDICAL CARE IF:   You seem to be getting worse and are unresponsive to treatment during an asthma attack.  You are short of breath even at rest.  You get short of breath when doing very little physical activity.  You have difficulty eating, drinking, or talking due to asthma symptoms.  You develop chest pain.  You develop a fast heartbeat.  You have a bluish color to your lips or fingernails.  You are lightheaded, dizzy, or faint.  Your peak flow is less than 50% of your personal best.  You have a fever or persistent symptoms for more than 2 3 days.  You have a fever and symptoms suddenly get worse. MAKE SURE YOU:   Understand these  instructions.  Will watch your condition.  Will get help right away if you are not doing well or get worse. Document Released: 04/19/2005 Document Revised: 12/20/2012 Document Reviewed: 11/16/2012 [facility] Patient Information 2014 [place], [place].  Abscess An abscess is an infected area that contains a collection of pus and debris.It can occur in almost any part of the body. An abscess is also known as a furuncle or boil. CAUSES  An abscess occurs when tissue gets infected. This can occur from blockage of oil or sweat glands, infection of hair follicles, or a minor injury to the skin. As the body tries to fight the infection, pus collects in the area and creates pressure under the skin. This pressure causes pain. People with weakened immune systems have difficulty fighting infections and get certain abscesses more often.  SYMPTOMS Usually an abscess develops on the skin and becomes a painful mass that is red, warm, and tender. If the abscess forms under the skin, you may feel a moveable soft area under the skin. Some abscesses break open (rupture) on their own, but most will continue to get worse without care. The infection can spread deeper into the body  and eventually into the bloodstream, causing you to feel ill.  DIAGNOSIS  Your caregiver will take your medical history and perform a physical exam. A sample of fluid may also be taken from the abscess to determine what is causing your infection. TREATMENT  Your caregiver may prescribe antibiotic medicines to fight the infection. However, taking antibiotics alone usually does not cure an abscess. Your caregiver may need to make a small cut (incision) in the abscess to drain the pus. In some cases, gauze is packed into the abscess to reduce pain and to continue draining the area. HOME CARE INSTRUCTIONS   Only take over-the-counter or prescription medicines for pain, discomfort, or fever as directed by your caregiver.  If you were prescribed  antibiotics, take them as directed. Finish them even if you start to feel better.  If gauze is used, follow your caregiver's directions for changing the gauze.  To avoid spreading the infection:  Keep your draining abscess covered with a bandage.  Wash your hands well.  Do not share personal care items, towels, or whirlpools with others.  Avoid skin contact with others.  Keep your skin and clothes clean around the abscess.  Keep all follow-up appointments as directed by your caregiver. SEEK MEDICAL CARE IF:   You have increased pain, swelling, redness, fluid drainage, or bleeding.  You have muscle aches, chills, or a general ill feeling.  You have a fever. MAKE SURE YOU:   Understand these instructions.  Will watch your condition.  Will get help right away if you are not doing well or get worse. Document Released: 01/27/2005 Document Revised: 10/19/2011 Document Reviewed: 07/02/2011 [facility] Patient Information 2014 [place], [place].

## 2013-08-08 NOTE — ED Notes (Signed)
Pt c/o sob with wheezing.

## 2013-08-08 NOTE — ED Provider Notes (Signed)
CSN: [id number]     Arrival date & time 08/08/13  0130 History   None    Chief Complaint  Patient presents with  . Shortness of Breath     (Consider location/radiation/quality/duration/timing/severity/associated sxs/prior Treatment) HPI History provided by patient. Past medical history of asthma, presents with shortness of breath and wheezing. Patient has ran out of her inhaler, is a smoker and does not have a primary care physician. Symptoms worse or lasts 24 hours with some dry cough. No chest pain or chest tightness. No known aggravating factors or triggers for her asthma. No associated fevers or chills. No vomiting or diarrhea. Patient also has chronic recurrent skin abscesses. She has discomfort in right and left axilla, under both breasts and in her groin region. She has seen a dermatologist in [place] and has been an on antibiotics for the same in the past. She is requesting advice at this time. No draining abscess. There are no areas that are more uncomfortable than  others. Past Medical History  Diagnosis Date  . Asthma    History reviewed. No pertinent past surgical history. History reviewed. No pertinent family history. History  Substance Use Topics  . Smoking status: Current Every Day Smoker    Types: Cigarettes  . Smokeless tobacco: Not on file  . Alcohol Use: No   OB History   Grav Para Term Preterm Abortions TAB SAB Ect Mult Living                 Review of Systems  Constitutional: Negative for fever and chills.  Eyes: Negative for visual disturbance.  Respiratory: Positive for shortness of breath and wheezing.   Cardiovascular: Negative for chest pain and leg swelling.  Gastrointestinal: Negative for abdominal pain.  Genitourinary: Negative for dysuria.  Musculoskeletal: Negative for back pain, neck pain and neck stiffness.  Skin: Negative for rash.  Neurological: Negative for headaches.  All other systems reviewed and are negative.     Allergies  Kiwi  extract; Amoxicillin; and Penicillins  Home Medications   Current Outpatient Rx  Name  Route  Sig  Dispense  Refill  . EXPIRED: albuterol (PROVENTIL HFA;VENTOLIN HFA) 108 (90 BASE) MCG/ACT inhaler   Inhalation   Inhale 1-2 puffs into the lungs every 6 (six) hours as needed for wheezing.   1 Inhaler   0   . albuterol (PROVENTIL) (2.5 MG/3ML) 0.083% nebulizer solution   Nebulization   Take 2.5 mg by nebulization every 4 (four) hours as needed for wheezing or shortness of breath.          Marland Kitchen azithromycin (ZITHROMAX) 250 MG tablet      1 tab by mouth daily for 4 days   4 tablet   0   . Fluticasone-Salmeterol (ADVAIR DISKUS) 100-50 MCG/DOSE AEPB   Inhalation   Inhale 1 puff into the lungs every 12 (twelve) hours.   60 each   0   . HYDROcodone-acetaminophen (NORCO) 10-325 MG per tablet   Oral   Take 1 tablet by mouth every 6 (six) hours as needed for moderate pain.         Marland Kitchen HYDROcodone-acetaminophen (NORCO/VICODIN) 5-325 MG per tablet   Oral   Take 1 tablet by mouth every 4 (four) hours as needed for moderate pain.   15 tablet   0    BP 126/74  Pulse 96  Temp(Src) 98.2 F (36.8 C) (Oral)  Resp 17  Wt 310 lb (140.615 kg)  SpO2 99%  LMP 07/30/2013 Physical Exam  Constitutional: She is oriented to person, place, and time. She appears well-developed and well-nourished.  HENT:  Head: Normocephalic and atraumatic.  Mouth/Throat: Oropharynx is clear and moist.  Eyes: EOM are normal. Pupils are equal, round, and reactive to light.  Neck: Neck supple.  Cardiovascular: Normal rate, regular rhythm and intact distal pulses.   Pulmonary/Chest: Effort normal. No stridor. No respiratory distress.  Bilateral expiratory wheezes without accessory muscle use or retractions  Abdominal: Soft. Bowel sounds are normal. She exhibits no distension. There is no tenderness.  Obese  Musculoskeletal: Normal range of motion. She exhibits no edema.  Neurological: She is alert and oriented  to person, place, and time. No cranial nerve deficit.  Skin: Skin is warm and dry.  Multiple areas of scars and old abscess 2 bilateral axilla, bilateral groin and under both breasts. There are no large or significantly tender areas. No erythema or increased warmth to touch.    ED Course  Procedures (including critical care time) Labs Review Labs Reviewed  CBG MONITORING, ED    Room air pulse ox 99% is adequate.  Albuterol inhaler provided with prednisone. Patient requesting CBG. Lungs sounds clear after treatment, remains no respiratory distress. CBG 91  Plan discharge home with inhaler, prescription for prednisone and referral to primary care physician. Patient states understanding need to stop smoking, followup with primary care physician, in addition to strict return precautions for any worsening condition.  MDM   Diagnosis: Asthma exacerbation, tobacco use, multiple early skin abscesses  Treated with albuterol treatment CBG reviewed Vital signs and nursing notes reviewed and considered  Sunnie NielsenBrian Elysse Polidore, MD 08/08/13 [id number]

## 2013-08-11 ENCOUNTER — Inpatient Hospital Stay (HOSPITAL_COMMUNITY)
Admission: EM | Admit: 2013-08-11 | Discharge: 2013-08-12 | DRG: 202 | Disposition: A | Discharge status: Home / Self Care | Payer: Medicare Other | Attending: Internal Medicine | Admitting: Internal Medicine

## 2013-08-11 ENCOUNTER — Encounter (HOSPITAL_COMMUNITY): Payer: Self-pay | Admitting: Emergency Medicine

## 2013-08-11 ENCOUNTER — Emergency Department (HOSPITAL_COMMUNITY): Payer: Medicare Other

## 2013-08-11 DIAGNOSIS — J45901 Unspecified asthma with (acute) exacerbation: Principal | ICD-10-CM | POA: Diagnosis present

## 2013-08-11 DIAGNOSIS — F17200 Nicotine dependence, unspecified, uncomplicated: Secondary | ICD-10-CM | POA: Diagnosis present

## 2013-08-11 DIAGNOSIS — E669 Obesity, unspecified: Secondary | ICD-10-CM

## 2013-08-11 DIAGNOSIS — R51 Headache: Secondary | ICD-10-CM | POA: Diagnosis present

## 2013-08-11 DIAGNOSIS — Z6841 Body Mass Index (BMI) 40.0 and over, adult: Secondary | ICD-10-CM

## 2013-08-11 DIAGNOSIS — R112 Nausea with vomiting, unspecified: Secondary | ICD-10-CM | POA: Diagnosis present

## 2013-08-11 DIAGNOSIS — L732 Hidradenitis suppurativa: Secondary | ICD-10-CM

## 2013-08-11 DIAGNOSIS — J4540 Moderate persistent asthma, uncomplicated: Secondary | ICD-10-CM

## 2013-08-11 LAB — BASIC METABOLIC PANEL
BUN: 7 mg/dL (ref 6–23)
CO2: 26 mEq/L (ref 19–32)
CREATININE: 0.58 mg/dL (ref 0.50–1.10)
Calcium: 8.7 mg/dL (ref 8.4–10.5)
Chloride: 102 mEq/L (ref 96–112)
GFR calc Af Amer: >90 mL/min (ref >90)
Glucose, Bld: 137 mg/dL — ABNORMAL HIGH (ref 70–99)
Potassium: 3.6 mEq/L — ABNORMAL LOW (ref 3.7–5.3)
Sodium: 141 mEq/L (ref 137–147)

## 2013-08-11 LAB — CBC WITH DIFFERENTIAL/PLATELET
Basophils Absolute: 0 10*3/uL (ref 0.0–0.1)
Basophils Relative: 0 % (ref 0–1)
Eosinophils Absolute: 0.3 10*3/uL (ref 0.0–0.7)
Eosinophils Relative: 3 % (ref 0–5)
HEMATOCRIT: 37.6 % (ref 36.0–46.0)
Hemoglobin: 12.2 g/dL (ref 12.0–15.0)
LYMPHS ABS: 3.7 10*3/uL (ref 0.7–4.0)
Lymphocytes Relative: 38 % (ref 12–46)
MCH: 26.9 pg (ref 26.0–34.0)
MCHC: 32.4 g/dL (ref 30.0–36.0)
MCV: 82.8 fL (ref 78.0–100.0)
MONO ABS: 0.7 10*3/uL (ref 0.1–1.0)
MONOS PCT: 7 % (ref 3–12)
Neutro Abs: 5 10*3/uL (ref 1.7–7.7)
Neutrophils Relative %: 52 % (ref 43–77)
Platelets: 392 10*3/uL (ref 150–400)
RBC: 4.54 MIL/uL (ref 3.87–5.11)
RDW: 16.7 % — ABNORMAL HIGH (ref 11.5–15.5)
WBC: 9.7 10*3/uL (ref 4.0–10.5)

## 2013-08-11 MED ORDER — GUAIFENESIN ER 600 MG PO TB12
600.0000 mg | ORAL_TABLET | Freq: Two times a day (BID) | ORAL | Status: DC
  Administered 2013-08-11 – 2013-08-12 (×3): 600 mg via ORAL
  Filled 2013-08-11 (×3): qty 1

## 2013-08-11 MED ORDER — METHYLPREDNISOLONE SODIUM SUCC 125 MG IJ SOLR: 60.0000 mg | Freq: Four times a day (QID) | INTRAMUSCULAR | Status: DC

## 2013-08-11 MED ORDER — SODIUM CHLORIDE 0.9 % IV SOLN: INTRAVENOUS | Status: DC

## 2013-08-11 MED ORDER — HYDROCODONE-ACETAMINOPHEN 10-325 MG PO TABS
1.0000 | ORAL_TABLET | Freq: Four times a day (QID) | ORAL | Status: DC | PRN
  Administered 2013-08-11 – 2013-08-12 (×3): 1 via ORAL
  Filled 2013-08-11 (×3): qty 1

## 2013-08-11 MED ORDER — IPRATROPIUM BROMIDE 0.02 % IN SOLN: 0.5000 mg | Freq: Four times a day (QID) | RESPIRATORY_TRACT | Status: DC

## 2013-08-11 MED ORDER — IPRATROPIUM-ALBUTEROL 0.5-2.5 (3) MG/3ML IN SOLN
3.0000 mL | Freq: Once | RESPIRATORY_TRACT | Status: AC
  Administered 2013-08-11: 3 mL via RESPIRATORY_TRACT
  Filled 2013-08-11: qty 3

## 2013-08-11 MED ORDER — ALBUTEROL SULFATE (2.5 MG/3ML) 0.083% IN NEBU
INHALATION_SOLUTION | RESPIRATORY_TRACT | Status: AC
  Administered 2013-08-11: 5 mg
  Filled 2013-08-11: qty 6

## 2013-08-11 MED ORDER — METHYLPREDNISOLONE SODIUM SUCC 125 MG IJ SOLR
125.0000 mg | Freq: Once | INTRAMUSCULAR | Status: AC
  Administered 2013-08-11: 125 mg via INTRAVENOUS
  Filled 2013-08-11: qty 2

## 2013-08-11 MED ORDER — IPRATROPIUM BROMIDE 0.02 % IN SOLN
RESPIRATORY_TRACT | Status: AC
  Administered 2013-08-11: 0.5 mg
  Filled 2013-08-11: qty 2.5

## 2013-08-11 MED ORDER — ENOXAPARIN SODIUM 40 MG/0.4ML ~~LOC~~ SOLN
40.0000 mg | SUBCUTANEOUS | Status: DC
  Administered 2013-08-11: 40 mg via SUBCUTANEOUS
  Filled 2013-08-11: qty 0.4

## 2013-08-11 MED ORDER — LEVOFLOXACIN IN D5W 500 MG/100ML IV SOLN
500.0000 mg | INTRAVENOUS | Status: DC
  Administered 2013-08-11: 500 mg via INTRAVENOUS
  Filled 2013-08-11 (×3): qty 100

## 2013-08-11 MED ORDER — METHYLPREDNISOLONE SODIUM SUCC 125 MG IJ SOLR
60.0000 mg | Freq: Four times a day (QID) | INTRAMUSCULAR | Status: DC
  Administered 2013-08-11 – 2013-08-12 (×4): 60 mg via INTRAVENOUS
  Filled 2013-08-11 (×4): qty 2

## 2013-08-11 MED ORDER — ALBUTEROL SULFATE (2.5 MG/3ML) 0.083% IN NEBU: 2.5000 mg | INHALATION_SOLUTION | Freq: Four times a day (QID) | RESPIRATORY_TRACT | Status: DC

## 2013-08-11 MED ORDER — ONDANSETRON 8 MG PO TBDP
8.0000 mg | ORAL_TABLET | Freq: Once | ORAL | Status: AC
  Administered 2013-08-11: 8 mg via ORAL
  Filled 2013-08-11: qty 1

## 2013-08-11 MED ORDER — ALBUTEROL SULFATE (2.5 MG/3ML) 0.083% IN NEBU
INHALATION_SOLUTION | RESPIRATORY_TRACT | Status: AC
  Filled 2013-08-11: qty 6

## 2013-08-11 MED ORDER — ONDANSETRON HCL 4 MG PO TABS: 4.0000 mg | ORAL_TABLET | Freq: Four times a day (QID) | ORAL | Status: DC | PRN

## 2013-08-11 MED ORDER — ALBUTEROL SULFATE (2.5 MG/3ML) 0.083% IN NEBU: 2.5000 mg | INHALATION_SOLUTION | RESPIRATORY_TRACT | Status: DC | PRN

## 2013-08-11 MED ORDER — ONDANSETRON HCL 4 MG/2ML IJ SOLN: 4.0000 mg | Freq: Four times a day (QID) | INTRAMUSCULAR | Status: DC | PRN

## 2013-08-11 MED ORDER — SODIUM CHLORIDE 0.9 % IV SOLN
INTRAVENOUS | Status: DC
  Administered 2013-08-11: 11:00:00 via INTRAVENOUS

## 2013-08-11 MED ORDER — ALBUTEROL SULFATE (2.5 MG/3ML) 0.083% IN NEBU
2.5000 mg | INHALATION_SOLUTION | RESPIRATORY_TRACT | Status: DC | PRN
  Filled 2013-08-11: qty 3

## 2013-08-11 MED ORDER — IPRATROPIUM-ALBUTEROL 0.5-2.5 (3) MG/3ML IN SOLN
3.0000 mL | Freq: Four times a day (QID) | RESPIRATORY_TRACT | Status: DC
  Administered 2013-08-11 – 2013-08-12 (×3): 3 mL via RESPIRATORY_TRACT
  Filled 2013-08-11 (×5): qty 3

## 2013-08-11 MED ORDER — IPRATROPIUM BROMIDE 0.02 % IN SOLN
RESPIRATORY_TRACT | Status: AC
  Filled 2013-08-11: qty 2.5

## 2013-08-11 NOTE — ED Provider Notes (Signed)
CSN: [id number]     Arrival date & time 08/11/13  0416 History   First MD Initiated Contact with Patient 08/11/13 [id number]     Chief Complaint  Patient presents with  . Asthma     (Consider location/radiation/quality/duration/timing/severity/associated sxs/prior Treatment) Patient is a 23 y.o. female presenting with asthma. The history is provided by the patient.  Asthma  She has had difficulty with her asthma throughout the day. She's been using her inhaler every 4 hours he continues to have episodes of wheezing. She woke up tonight with the persistent wheezing. She's had a cough productive of white to yellowish sputum. There's been associated nausea and vomiting. She denies fever chills or sweats. She denies arthralgias or myalgias. She denies diarrhea. She is a smoker but states that she has not smoked today.  Past Medical History  Diagnosis Date  . Asthma    History reviewed. No pertinent past surgical history. No family history on file. History  Substance Use Topics  . Smoking status: Current Every Day Smoker    Types: Cigarettes  . Smokeless tobacco: Not on file  . Alcohol Use: No   OB History   Grav Para Term Preterm Abortions TAB SAB Ect Mult Living                 Review of Systems  All other systems reviewed and are negative.     Allergies  Kiwi extract; Amoxicillin; and Penicillins  Home Medications   Current Outpatient Rx  Name  Route  Sig  Dispense  Refill  . EXPIRED: albuterol (PROVENTIL HFA;VENTOLIN HFA) 108 (90 BASE) MCG/ACT inhaler   Inhalation   Inhale 1-2 puffs into the lungs every 6 (six) hours as needed for wheezing.   1 Inhaler   0   . albuterol (PROVENTIL HFA;VENTOLIN HFA) 108 (90 BASE) MCG/ACT inhaler   Inhalation   Inhale 1-2 puffs into the lungs every 6 (six) hours as needed for wheezing or shortness of breath.   1 Inhaler   0   . albuterol (PROVENTIL) (2.5 MG/3ML) 0.083% nebulizer solution   Nebulization   Take 2.5 mg by nebulization  every 4 (four) hours as needed for wheezing or shortness of breath.          Marland Kitchen azithromycin (ZITHROMAX) 250 MG tablet      1 tab by mouth daily for 4 days   4 tablet   0   . Fluticasone-Salmeterol (ADVAIR DISKUS) 100-50 MCG/DOSE AEPB   Inhalation   Inhale 1 puff into the lungs every 12 (twelve) hours.   60 each   0   . HYDROcodone-acetaminophen (NORCO) 10-325 MG per tablet   Oral   Take 1 tablet by mouth every 6 (six) hours as needed for moderate pain.         Marland Kitchen HYDROcodone-acetaminophen (NORCO/VICODIN) 5-325 MG per tablet   Oral   Take 1 tablet by mouth every 4 (four) hours as needed for moderate pain.   15 tablet   0   . predniSONE (DELTASONE) 20 MG tablet   Oral   Take 3 tablets (60 mg total) by mouth daily.   15 tablet   0   . sulfamethoxazole-trimethoprim (SEPTRA DS) 800-160 MG per tablet   Oral   Take 1 tablet by mouth every 12 (twelve) hours.   10 tablet   0    BP 123/88  Pulse 109  Temp(Src) 98.1 F (36.7 C) (Oral)  Resp 30  SpO2 95%  LMP 07/30/2013 Physical Exam  Nursing note and vitals reviewed.  23 year old female, who appears moderately dyspneic, but has in no acute distress. Vital signs are significant for tachypnea with respiratory rate of 30, and tachycardia with heart rate 109. Oxygen saturation is 95%, which is normal. Head is normocephalic and atraumatic. PERRLA, EOMI. Oropharynx is clear. Neck is nontender and supple without adenopathy or JVD. Back is nontender and there is no CVA tenderness. Lungs are have diffuse expiratory wheezes without rales or rhonchi. She is not using accessory muscles of respiration. Chest is nontender. Heart has regular rate and rhythm without murmur. Abdomen is soft, flat, nontender without masses or hepatosplenomegaly and peristalsis is normoactive. Extremities have no cyanosis or edema, full range of motion is present. Skin is warm and dry without rash. Neurologic: Mental status is normal, cranial nerves are  intact, there are no motor or sensory deficits.  ED Course  Procedures (including critical care time) Labs Review Results for orders placed during the hospital encounter of 08/11/13  CBC WITH DIFFERENTIAL      Result Value Ref Range   WBC 9.7  4.0 - 10.5 K/uL   RBC 4.54  3.87 - 5.11 MIL/uL   Hemoglobin 12.2  12.0 - 15.0 g/dL   HCT 16.1  09.6 - 04.5 %   MCV 82.8  78.0 - 100.0 fL   MCH 26.9  26.0 - 34.0 pg   MCHC 32.4  30.0 - 36.0 g/dL   RDW 40.9 (*) 81.1 - 91.4 %   Platelets 392  150 - 400 K/uL   Neutrophils Relative % 52  43 - 77 %   Neutro Abs 5.0  1.7 - 7.7 K/uL   Lymphocytes Relative 38  12 - 46 %   Lymphs Abs 3.7  0.7 - 4.0 K/uL   Monocytes Relative 7  3 - 12 %   Monocytes Absolute 0.7  0.1 - 1.0 K/uL   Eosinophils Relative 3  0 - 5 %   Eosinophils Absolute 0.3  0.0 - 0.7 K/uL   Basophils Relative 0  0 - 1 %   Basophils Absolute 0.0  0.0 - 0.1 K/uL  BASIC METABOLIC PANEL      Result Value Ref Range   Sodium 141  137 - 147 mEq/L   Potassium 3.6 (*) 3.7 - 5.3 mEq/L   Chloride 102  96 - 112 mEq/L   CO2 26  19 - 32 mEq/L   Glucose, Bld 137 (*) 70 - 99 mg/dL   BUN 7  6 - 23 mg/dL   Creatinine, Ser 7.82  0.50 - 1.10 mg/dL   Calcium 8.7  8.4 - 95.6 mg/dL   GFR calc non Af Amer >90  >90 mL/min   GFR calc Af Amer >90  >90 mL/min   Imaging Review Dg Chest 2 View  08/11/2013   CLINICAL DATA:  Shortness of breath.  History of asthma.  EXAM: CHEST  2 VIEW  COMPARISON:  10/18/2011  FINDINGS: The heart size and mediastinal contours are within normal limits. Both lungs are clear. The visualized skeletal structures are unremarkable.  IMPRESSION: No active cardiopulmonary disease.   Electronically Signed   By: Burman Nieves M.D.   On: 08/11/2013 05:24   CRITICAL CARE Performed by: Dione Booze Total critical care time: 40 minutes Critical care time was exclusive of separately billable procedures and treating other patients. Critical care was necessary to treat or prevent imminent  or life-threatening deterioration. Critical care was time spent personally by me on the  following activities: development of treatment plan with patient and/or surrogate as well as nursing, discussions with consultants, evaluation of patient's response to treatment, examination of patient, obtaining history from patient or surrogate, ordering and performing treatments and interventions, ordering and review of laboratory studies, ordering and review of radiographic studies, pulse oximetry and re-evaluation of patient's condition.  MDM   Final diagnoses:  Asthma exacerbation    Exacerbation of asthma. Old records are reviewed and she was seen in the ED 2 days ago and discharged with prescriptions for prednisone and trimethoprim-sulfamethoxazole. Chest x-ray will be obtained to rule out pneumonia and she's given albuterol with ipratropium. She will be given additional steroids in the ED.  5:03 AM There was no significant improvement after initial albuterol with ipratropium nebulizer treatment and they will be repeated. I am concerned with her symptoms worsen while on steroids and failure to respond to albuterol with ipratropium. She will need to be observed closely and may need to be admitted as a treatment failure.  5:37 AM There was little improvement after second nebulizer treatment. She'll be given a third nebulizer treatment. Following this, a decision will need to be made regarding admission.  6:16 AM No improvement after third nebulizer treatment. [facility] will be consult for admission.  Discussed with Dr. Sharl MaLama of [facility] who agrees to admit the patient.  Dione Boozeavid Kellee Sittner, MD 08/11/13 [id number]

## 2013-08-11 NOTE — ED Notes (Signed)
Pt. C/o painful rash to underarms. Hospitalist made aware.

## 2013-08-11 NOTE — ED Notes (Signed)
RT at bedside.

## 2013-08-11 NOTE — H&P (Signed)
PCP:   No PCP Per Patient   Chief Complaint:  Shortness of breath  HPI:  23 year old female with a history of asthma, who was seen in the ED 2 days ago for asthma exacerbation and sent home on antibiotics and prednisone came back to the hospital with worsening shortness of breath. Patient says that she used inhalers every 4 hours along with steroids antibiotics but her shortness of breath was not getting any better. She also complains of coughing up white-colored phlegm. Patient also complains of nausea and vomiting with associated headaches. Patient has history of recurrent skin abscesses and this currently followed by a dermatologist as outpatient. She says that she had flareup of these abscesses in the axilla. She denies any fever, complains of chest pain with coughing, no diarrhea.  Allergies:   Allergies  Allergen Reactions  . Kiwi Extract Shortness Of Breath and Swelling    Throat and lip swelling  . Amoxicillin Hives  . Penicillins Hives      Past Medical History  Diagnosis Date  . Asthma     History reviewed. No pertinent past surgical history.  Prior to Admission medications   Medication Sig Start Date End Date Taking? Authorizing Provider  albuterol (PROVENTIL HFA;VENTOLIN HFA) 108 (90 BASE) MCG/ACT inhaler Inhale 1-2 puffs into the lungs every 6 (six) hours as needed for wheezing. 10/18/11 05/01/13  Shelda JakesScott W. Zackowski, MD  albuterol (PROVENTIL HFA;VENTOLIN HFA) 108 (90 BASE) MCG/ACT inhaler Inhale 1-2 puffs into the lungs every 6 (six) hours as needed for wheezing or shortness of breath. 08/08/13   Sunnie NielsenBrian Opitz, MD  albuterol (PROVENTIL) (2.5 MG/3ML) 0.083% nebulizer solution Take 2.5 mg by nebulization every 4 (four) hours as needed for wheezing or shortness of breath.     Historical Provider, MD  azithromycin (ZITHROMAX) 250 MG tablet 1 tab by mouth daily for 4 days 05/01/13   Burgess AmorJulie Idol, PA-C  Fluticasone-Salmeterol (ADVAIR DISKUS) 100-50 MCG/DOSE AEPB Inhale 1 puff  into the lungs every 12 (twelve) hours. 05/01/13   Burgess AmorJulie Idol, PA-C  HYDROcodone-acetaminophen (NORCO) 10-325 MG per tablet Take 1 tablet by mouth every 6 (six) hours as needed for moderate pain.    Historical Provider, MD  HYDROcodone-acetaminophen (NORCO/VICODIN) 5-325 MG per tablet Take 1 tablet by mouth every 4 (four) hours as needed for moderate pain. 05/01/13   Burgess AmorJulie Idol, PA-C  predniSONE (DELTASONE) 20 MG tablet Take 3 tablets (60 mg total) by mouth daily. 08/08/13   Sunnie NielsenBrian Opitz, MD  sulfamethoxazole-trimethoprim (SEPTRA DS) 800-160 MG per tablet Take 1 tablet by mouth every 12 (twelve) hours. 08/08/13   Sunnie NielsenBrian Opitz, MD    Social History:  reports that she has been smoking Cigarettes.  She has been smoking about 0.00 packs per day. She does not have any smokeless tobacco history on file. She reports that she does not drink alcohol or use illicit drugs.  No family history on file.   All the positives are listed in BOLD  Review of Systems:  HEENT: Headache, blurred vision, runny nose, sore throat Neck: Hypothyroidism, hyperthyroidism,,lymphadenopathy Chest : Shortness of breath, history of COPD, Asthma Heart : Chest pain, history of coronary arterey disease GI:  Nausea, vomiting, diarrhea, constipation, GERD GU: Dysuria, urgency, frequency of urination, hematuria Neuro: Stroke, seizures, syncope Psych: Depression, anxiety, hallucinations   Physical Exam: Blood pressure 129/64, pulse 115, temperature 98.1 F (36.7 C), temperature source Oral, resp. rate 22, last menstrual period 07/30/2013, SpO2 95.00%. Constitutional:   Patient is a well-developed and well-nourished female  in no acute distress and cooperative with exam. Head: Normocephalic and atraumatic Mouth: Mucus membranes moist Eyes: PERRL, EOMI, conjunctivae normal Neck: Supple, No Thyromegaly Cardiovascular: RRR, S1 normal, S2 normal Pulmonary/Chest: Bilateral wheezing Abdominal: Soft. Non-tender, non-distended, bowel  sounds are normal, no masses, organomegaly, or guarding present.  Neurological: A&O x3, Strenght is normal and symmetric bilaterally, cranial nerve II-XII are grossly intact, no focal motor deficit, sensory intact to light touch bilaterally.  Extremities : Multiple healed skin abscesses noted in both the axilla, with some active lesions, no erythema or cellulitis noted at this time.    Labs on Admission:  Results for orders placed during the hospital encounter of 08/11/13 (from the past 48 hour(s))  CBC WITH DIFFERENTIAL     Status: Abnormal   Collection Time    08/11/13  6:00 AM      Result Value Ref Range   WBC 9.7  4.0 - 10.5 K/uL   RBC 4.54  3.87 - 5.11 MIL/uL   Hemoglobin 12.2  12.0 - 15.0 g/dL   HCT 16.1  09.6 - 04.5 %   MCV 82.8  78.0 - 100.0 fL   MCH 26.9  26.0 - 34.0 pg   MCHC 32.4  30.0 - 36.0 g/dL   RDW 40.9 (*) 81.1 - 91.4 %   Platelets 392  150 - 400 K/uL   Neutrophils Relative % 52  43 - 77 %   Neutro Abs 5.0  1.7 - 7.7 K/uL   Lymphocytes Relative 38  12 - 46 %   Lymphs Abs 3.7  0.7 - 4.0 K/uL   Monocytes Relative 7  3 - 12 %   Monocytes Absolute 0.7  0.1 - 1.0 K/uL   Eosinophils Relative 3  0 - 5 %   Eosinophils Absolute 0.3  0.0 - 0.7 K/uL   Basophils Relative 0  0 - 1 %   Basophils Absolute 0.0  0.0 - 0.1 K/uL    Radiological Exams on Admission: Dg Chest 2 View  08/11/2013   CLINICAL DATA:  Shortness of breath.  History of asthma.  EXAM: CHEST  2 VIEW  COMPARISON:  10/18/2011  FINDINGS: The heart size and mediastinal contours are within normal limits. Both lungs are clear. The visualized skeletal structures are unremarkable.  IMPRESSION: No active cardiopulmonary disease.   Electronically Signed   By: Burman Nieves M.D.   On: 08/11/2013 05:24    Assessment/Plan Active Problems:   Asthma exacerbation   Hidradenitis suppurativa  Asthma exacerbation Patient will be started on DuoNeb nebulizers every 6 hours, Solu-Medrol 60 mg IV every 6 hours, Mucinex 600  mg every 2 hours, IV Levaquin. Once patient starts improving she can be transitioned to oral voids and by mouth antibiotics.  Hidradenitis suppurativa Patient has active lesions in the axilla, she has been started on IV Levaquin which might help the condition, if no improvement consider starting her on doxycycline. Patient will followup with her dermatologist as outpatient.  DVT prophylaxis Lovenox  Code status: Patient is full code  Family discussion: No family at bedside   Time Spent on Admission: 55 minutes  Meredeth Ide [facility] Pager: [phone] 08/11/2013, 6:43 AM  If 7PM-7AM, please contact night-coverage  [email or web address]  Password [email or web address]

## 2013-08-11 NOTE — Progress Notes (Signed)
[facility] PROGRESS NOTE  Mindy Clark ZOX:[id number] DOB: May 21, 1990 DOA: 08/11/2013 PCP: No PCP Per Patient  Assessment/Plan  Asthma exacerbation, improving but still very diminished -  Continue solumedrol at current dose today -  Continue duonebs with prn albuterol -  Continue levofloxacin  Hidradenitis suppurativa  -  Continue levofloxacin  -  F/u dermatology   Diet:  regular Access:  PIV IVF:  yes Proph:  lovenox  Code Status: full Family Communication: patient and her girlfriend Disposition Plan:  Pending further improvement in asthma symptoms   Consultants:  none  Procedures:  CXR  Antibiotics:  Levofloxacin 4/11   HPI/Subjective:  SOB improving gradually   Objective: Filed Vitals:   08/11/13 0532 08/11/13 0600 08/11/13 0807 08/11/13 1006  BP: 129/64  110/74   Pulse: 115  106   Temp:   98.4 F (36.9 C)   TempSrc:   Oral   Resp: 22  22   Height:   5\' 2"  (1.575 m)   Weight:   140.1 kg (308 lb 13.8 oz)   SpO2: 94% 95% 95% 94%   No intake or output data in the 24 hours ending 08/11/13 1356 Filed Weights   08/11/13 0807  Weight: 140.1 kg (308 lb 13.8 oz)    Exam:   General:  Obese AAF, No acute distress  HEENT:  NCAT, MMM  Cardiovascular:  Tachycardic regular rhythm, nl S1, S2 no mrg, 2+ pulses, warm extremities  Respiratory:  Diminished with high pitched exp wheeze bilaterally and prolonged exp phase, no rales, no increased WOB  Abdomen:   NABS, soft, NT/ND  MSK:   Normal tone and bulk, no LEE  Neuro:  Grossly intact  Data Reviewed: Basic Metabolic Panel:  Recent Labs Lab 08/11/13 0600  NA 141  K 3.6*  CL 102  CO2 26  GLUCOSE 137*  BUN 7  CREATININE 0.58  CALCIUM 8.7   Liver Function Tests: No results found for this basename: AST, ALT, ALKPHOS, BILITOT, PROT, ALBUMIN,  in the last 168 hours No results found for this basename: LIPASE, AMYLASE,  in the last 168 hours No results found for this basename: AMMONIA,   in the last 168 hours CBC:  Recent Labs Lab 08/11/13 0600  WBC 9.7  NEUTROABS 5.0  HGB 12.2  HCT 37.6  MCV 82.8  PLT 392   Cardiac Enzymes: No results found for this basename: CKTOTAL, CKMB, CKMBINDEX, TROPONINI,  in the last 168 hours BNP (last 3 results) No results found for this basename: PROBNP,  in the last 8760 hours CBG:  Recent Labs Lab 08/08/13 0409  GLUCAP 91    No results found for this or any previous visit (from the past 240 hour(s)).   Studies: Dg Chest 2 View  08/11/2013   CLINICAL DATA:  Shortness of breath.  History of asthma.  EXAM: CHEST  2 VIEW  COMPARISON:  10/18/2011  FINDINGS: The heart size and mediastinal contours are within normal limits. Both lungs are clear. The visualized skeletal structures are unremarkable.  IMPRESSION: No active cardiopulmonary disease.   Electronically Signed   By: Burman Nieves M.D.   On: 08/11/2013 05:24    Scheduled Meds: . sodium chloride   Intravenous STAT  . enoxaparin (LOVENOX) injection  40 mg Subcutaneous Q24H  . guaiFENesin  600 mg Oral BID  . ipratropium-albuterol  3 mL Nebulization Q6H  . levofloxacin (LEVAQUIN) IV  500 mg Intravenous Q24H  . methylPREDNISolone (SOLU-MEDROL) injection  60 mg Intravenous Q6H  Continuous Infusions: . sodium chloride 75 mL/hr at 08/11/13 1030    Active Problems:   Asthma exacerbation   Hidradenitis suppurativa    Time spent: 30 min    Renae FickleMackenzie Jamael Hoffmann  [facility] Pager [phone]. If 7PM-7AM, please contact night-coverage at [email or web address], password [facility] 08/11/2013, 1:56 PM  LOS: 0 days

## 2013-08-11 NOTE — Progress Notes (Addendum)
PT given albuterol 5.0mg  and atrovent 0.5mg  since RX pulled under number of earlier er admission. Mar reads read only.  Mar has been corrected by me.

## 2013-08-11 NOTE — ED Notes (Signed)
Pt. Reports having an asthma attack at home. Pt. Reports taking pro-air at home with no relief. Pt. 96% on room air.

## 2013-08-12 DIAGNOSIS — J4540 Moderate persistent asthma, uncomplicated: Secondary | ICD-10-CM

## 2013-08-12 DIAGNOSIS — E669 Obesity, unspecified: Secondary | ICD-10-CM

## 2013-08-12 MED ORDER — METHYLPREDNISOLONE SODIUM SUCC 125 MG IJ SOLR: 60.0000 mg | Freq: Two times a day (BID) | INTRAMUSCULAR | Status: DC

## 2013-08-12 MED ORDER — PREDNISONE 20 MG PO TABS: ORAL_TABLET | ORAL | Status: DC

## 2013-08-12 MED ORDER — FLUTICASONE-SALMETEROL 250-50 MCG/DOSE IN AEPB: 1.0000 | INHALATION_SPRAY | Freq: Two times a day (BID) | RESPIRATORY_TRACT | Status: DC

## 2013-08-12 MED ORDER — DOXYCYCLINE HYCLATE 100 MG PO CAPS: 100.0000 mg | ORAL_CAPSULE | Freq: Two times a day (BID) | ORAL | Status: DC

## 2013-08-12 MED ORDER — PREDNISONE 20 MG PO TABS
60.0000 mg | ORAL_TABLET | Freq: Once | ORAL | Status: AC
  Administered 2013-08-12: 60 mg via ORAL
  Filled 2013-08-12: qty 3

## 2013-08-12 NOTE — Progress Notes (Signed)
Patient refusing AM labs.  Explained the importance of morning labs and patient still refuses lab draw.

## 2013-08-12 NOTE — Discharge Instructions (Signed)

## 2013-08-12 NOTE — Progress Notes (Signed)
C/O stinging and burning when IV flushed this AM.  Patient refusing new IV site. Explained to the patient the importance of the IV site.  Patient still refusing new IV.  Removed old IV per protocol.  Will continue to monitor and will pass to the oncoming nurse.

## 2013-08-12 NOTE — Progress Notes (Signed)
AVS reviewed with patient.  Prescriptions provided to patient.  Follow-up visit scheduled with Dr. Juanetta GoslingHawkins (on-call MD for ED on date of admission).  Verbalized understanding of medications (new, when, where, why), physician follow-up and discharge instructions.  MD instructions for patient to take Ibuprofen for pain until follow-up visit.  Patient waiting on ride and stable.

## 2013-08-12 NOTE — Discharge Summary (Signed)
Physician Discharge Summary  Mindy Clark:[id number] DOB: March 22, 1991 DOA: 08/11/2013  PCP: No PCP Per Patient  Admit date: 08/11/2013 Discharge date: 08/12/2013  Recommendations for Outpatient Follow-up:  1. Follow up with primary care doctor in 1 to 2 weeks for check up on asthma 2. F/u with dermatologist at already scheduled appt for hidradenitis suppurativa  Discharge Diagnoses:  Active Problems:   Asthma exacerbation   Hidradenitis suppurativa   Obesity   Discharge Condition: stable, improved  Diet recommendation:  Healthy heart  Wt Readings from Last 3 Encounters:  08/11/13 140.1 kg (308 lb 13.8 oz)  08/08/13 140.615 kg (310 lb)  05/01/13 139.481 kg (307 lb 8 oz)    History of present illness:  23 year old female with a history of asthma, who was seen in the ED 2 days ago for asthma exacerbation and sent home on antibiotics and prednisone came back to the hospital with worsening shortness of breath. Patient says that she used inhalers every 4 hours along with steroids antibiotics but her shortness of breath was not getting any better. She also complains of coughing up white-colored phlegm. Patient also complains of nausea and vomiting with associated headaches. Patient has history of recurrent skin abscesses and this currently followed by a dermatologist as outpatient. She says that she had flareup of these abscesses in the axilla. She denies any fever, complains of chest pain with coughing, no diarrhea.  Hospital Course:   Asthma exacerbation, Initially started on solumedrol and duonebs and had quick recovery.  She has been able to ambulate the halls without shortness of breath and feels well.  Advised her to minimize her exposure to pollen triggers when able.  Increased advair and continued prn albuterol.  Follow up with primary care doctor for further asthma management.  Given a long course of steroids given high pollen count at this time.    Hidradenitis suppurativa,  painful.  Yashika Mask course of antibiotics and follow up with dermatology.    Consultants:  none Procedures:  CXR Antibiotics:  Levofloxacin 4/11 > 4/12 Doxycycline 4/12 >>    Discharge Exam: Filed Vitals:   08/12/13 0610  BP: 128/56  Pulse: 103  Temp: 97.9 F (36.6 C)  Resp: 22   Filed Vitals:   08/11/13 1509 08/11/13 2249 08/12/13 0610 08/12/13 0807  BP:  121/59 128/56   Pulse:  101 103   Temp:  98 F (36.7 C) 97.9 F (36.6 C)   TempSrc:  Oral Oral   Resp:  20 22   Height:      Weight:      SpO2: 98% 99% 96% 93%    General: Obese AAF, No acute distress, able to converse in long sentences without shortness of breath HEENT: NCAT, MMM  Cardiovascular:  RRR, nl S1, S2 no mrg, 2+ pulses, warm extremities  Respiratory: Diminished with wheeze bilaterally, no rales, no increased WOB  Abdomen: NABS, soft, NT/ND  MSK: Normal tone and bulk, no LEE  Neuro: Grossly intact   Discharge Instructions      Discharge Orders   Future Orders Complete By Expires   Call MD for:  difficulty breathing, headache or visual disturbances  As directed    Call MD for:  extreme fatigue  As directed    Call MD for:  hives  As directed    Call MD for:  persistant dizziness or light-headedness  As directed    Call MD for:  persistant nausea and vomiting  As directed    Call MD  for:  severe uncontrolled pain  As directed    Call MD for:  temperature >100.4  As directed    Diet - low sodium heart healthy  As directed    Discharge instructions  As directed    Increase activity slowly  As directed        Medication List    STOP taking these medications       azithromycin 250 MG tablet  Commonly known as:  ZITHROMAX     Fluticasone-Salmeterol 100-50 MCG/DOSE Aepb  Commonly known as:  ADVAIR DISKUS  Replaced by:  Fluticasone-Salmeterol 250-50 MCG/DOSE Aepb     sulfamethoxazole-trimethoprim 800-160 MG per tablet  Commonly known as:  SEPTRA DS      TAKE these medications        albuterol 108 (90 BASE) MCG/ACT inhaler  Commonly known as:  PROVENTIL HFA;VENTOLIN HFA  Inhale 1-2 puffs into the lungs every 6 (six) hours as needed for wheezing or shortness of breath.     doxycycline 100 MG capsule  Commonly known as:  VIBRAMYCIN  Take 1 capsule (100 mg total) by mouth 2 (two) times daily.     Fluticasone-Salmeterol 250-50 MCG/DOSE Aepb  Commonly known as:  ADVAIR DISKUS  Inhale 1 puff into the lungs 2 (two) times daily.     predniSONE 20 MG tablet  Commonly known as:  DELTASONE  Take 3 tabs twice a day x 1 day, then 3 tabs daily x 3 days, 2 tabs daily x 3 days, 1 tab daily x 3 days, half tab x 4 days, then stop       Follow-up Information   Follow up with primary care doctor. Schedule an appointment as soon as possible for a visit in 1 week.       The results of significant diagnostics from this hospitalization (including imaging, microbiology, ancillary and laboratory) are listed below for reference.    Significant Diagnostic Studies: Dg Chest 2 View  08/11/2013   CLINICAL DATA:  Shortness of breath.  History of asthma.  EXAM: CHEST  2 VIEW  COMPARISON:  10/18/2011  FINDINGS: The heart size and mediastinal contours are within normal limits. Both lungs are clear. The visualized skeletal structures are unremarkable.  IMPRESSION: No active cardiopulmonary disease.   Electronically Signed   By: Burman NievesWilliam  Stevens M.D.   On: 08/11/2013 05:24    Microbiology: No results found for this or any previous visit (from the past 240 hour(s)).   Labs: Basic Metabolic Panel:  Recent Labs Lab 08/11/13 0600  NA 141  K 3.6*  CL 102  CO2 26  GLUCOSE 137*  BUN 7  CREATININE 0.58  CALCIUM 8.7   Liver Function Tests: No results found for this basename: AST, ALT, ALKPHOS, BILITOT, PROT, ALBUMIN,  in the last 168 hours No results found for this basename: LIPASE, AMYLASE,  in the last 168 hours No results found for this basename: AMMONIA,  in the last 168  hours CBC:  Recent Labs Lab 08/11/13 0600  WBC 9.7  NEUTROABS 5.0  HGB 12.2  HCT 37.6  MCV 82.8  PLT 392   Cardiac Enzymes: No results found for this basename: CKTOTAL, CKMB, CKMBINDEX, TROPONINI,  in the last 168 hours BNP: BNP (last 3 results) No results found for this basename: PROBNP,  in the last 8760 hours CBG:  Recent Labs Lab 08/08/13 0409  GLUCAP 91    Time coordinating discharge: 45 minutes  Signed:  Renae FickleMackenzie Haroldine Redler  [facility] 08/12/2013, 9:56 AM

## 2013-08-21 NOTE — Care Management Note (Signed)
UR completed 

## 2013-10-25 ENCOUNTER — Encounter (HOSPITAL_COMMUNITY): Payer: Self-pay | Admitting: Emergency Medicine

## 2013-10-25 ENCOUNTER — Emergency Department (HOSPITAL_COMMUNITY): Payer: Medicare Other

## 2013-10-25 ENCOUNTER — Emergency Department (HOSPITAL_COMMUNITY)
Admission: EM | Admit: 2013-10-25 | Discharge: 2013-10-25 | Disposition: A | Discharge status: Home / Self Care | Payer: Medicare Other | Attending: Emergency Medicine | Admitting: Emergency Medicine

## 2013-10-25 DIAGNOSIS — F17200 Nicotine dependence, unspecified, uncomplicated: Secondary | ICD-10-CM | POA: Diagnosis not present

## 2013-10-25 DIAGNOSIS — Z3202 Encounter for pregnancy test, result negative: Secondary | ICD-10-CM | POA: Diagnosis not present

## 2013-10-25 DIAGNOSIS — J45909 Unspecified asthma, uncomplicated: Secondary | ICD-10-CM | POA: Insufficient documentation

## 2013-10-25 DIAGNOSIS — Z880 Allergy status to penicillin: Secondary | ICD-10-CM | POA: Diagnosis not present

## 2013-10-25 DIAGNOSIS — R1013 Epigastric pain: Secondary | ICD-10-CM | POA: Insufficient documentation

## 2013-10-25 DIAGNOSIS — K8020 Calculus of gallbladder without cholecystitis without obstruction: Secondary | ICD-10-CM | POA: Diagnosis not present

## 2013-10-25 DIAGNOSIS — R1110 Vomiting, unspecified: Secondary | ICD-10-CM | POA: Diagnosis present

## 2013-10-25 DIAGNOSIS — R112 Nausea with vomiting, unspecified: Secondary | ICD-10-CM | POA: Insufficient documentation

## 2013-10-25 LAB — COMPREHENSIVE METABOLIC PANEL
ALK PHOS: 70 U/L (ref 39–117)
ALT: 18 U/L (ref 0–35)
AST: 16 U/L (ref 0–37)
Albumin: 3.6 g/dL (ref 3.5–5.2)
BUN: 7 mg/dL (ref 6–23)
CHLORIDE: 99 meq/L (ref 96–112)
CO2: 29 meq/L (ref 19–32)
CREATININE: 0.65 mg/dL (ref 0.50–1.10)
Calcium: 9.1 mg/dL (ref 8.4–10.5)
GFR calc Af Amer: >90 mL/min (ref >90)
GLUCOSE: 125 mg/dL — AB (ref 70–99)
POTASSIUM: 4 meq/L (ref 3.7–5.3)
Sodium: 139 mEq/L (ref 137–147)
Total Bilirubin: <0.2 mg/dL — ABNORMAL LOW (ref 0.3–1.2)
Total Protein: 8.2 g/dL (ref 6.0–8.3)

## 2013-10-25 LAB — URINALYSIS, ROUTINE W REFLEX MICROSCOPIC
Bilirubin Urine: NEGATIVE
Glucose, UA: NEGATIVE mg/dL
Hgb urine dipstick: NEGATIVE
Ketones, ur: NEGATIVE mg/dL
LEUKOCYTES UA: NEGATIVE
NITRITE: NEGATIVE
PROTEIN: NEGATIVE mg/dL
Specific Gravity, Urine: 1.015 (ref 1.005–1.030)
Urobilinogen, UA: 0.2 mg/dL (ref 0.0–1.0)
pH: 6.5 (ref 5.0–8.0)

## 2013-10-25 LAB — CBC WITH DIFFERENTIAL/PLATELET
Basophils Absolute: 0 10*3/uL (ref 0.0–0.1)
Basophils Relative: 0 % (ref 0–1)
Eosinophils Absolute: 0.2 10*3/uL (ref 0.0–0.7)
Eosinophils Relative: 2 % (ref 0–5)
HCT: 37.4 % (ref 36.0–46.0)
HEMOGLOBIN: 12.1 g/dL (ref 12.0–15.0)
LYMPHS ABS: 2.4 10*3/uL (ref 0.7–4.0)
Lymphocytes Relative: 28 % (ref 12–46)
MCH: 26.5 pg (ref 26.0–34.0)
MCHC: 32.4 g/dL (ref 30.0–36.0)
MCV: 81.8 fL (ref 78.0–100.0)
MONO ABS: 0.6 10*3/uL (ref 0.1–1.0)
MONOS PCT: 7 % (ref 3–12)
Neutro Abs: 5.3 10*3/uL (ref 1.7–7.7)
Neutrophils Relative %: 63 % (ref 43–77)
Platelets: 481 10*3/uL — ABNORMAL HIGH (ref 150–400)
RBC: 4.57 MIL/uL (ref 3.87–5.11)
RDW: 16.7 % — ABNORMAL HIGH (ref 11.5–15.5)
WBC: 8.5 10*3/uL (ref 4.0–10.5)

## 2013-10-25 LAB — PREGNANCY, URINE: PREG TEST UR: NEGATIVE

## 2013-10-25 MED ORDER — HYDROCODONE-ACETAMINOPHEN 5-325 MG PO TABS: 1.0000 | ORAL_TABLET | ORAL | Status: DC | PRN

## 2013-10-25 MED ORDER — ONDANSETRON HCL 8 MG PO TABS: 8.0000 mg | ORAL_TABLET | Freq: Three times a day (TID) | ORAL | Status: DC | PRN

## 2013-10-25 MED ORDER — SODIUM CHLORIDE 0.9 % IV BOLUS (SEPSIS)
2000.0000 mL | Freq: Once | INTRAVENOUS | Status: AC
  Administered 2013-10-25: 1000 mL via INTRAVENOUS

## 2013-10-25 MED ORDER — SODIUM CHLORIDE 0.9 % IV SOLN
INTRAVENOUS | Status: DC
Start: 2013-10-25 — End: 2013-10-25

## 2013-10-25 MED ORDER — ONDANSETRON HCL 4 MG/2ML IJ SOLN
4.0000 mg | Freq: Once | INTRAMUSCULAR | Status: AC
  Administered 2013-10-25: 4 mg via INTRAVENOUS
  Filled 2013-10-25: qty 2

## 2013-10-25 MED ORDER — HYDROMORPHONE HCL PF 1 MG/ML IJ SOLN
1.0000 mg | Freq: Once | INTRAMUSCULAR | Status: AC
  Administered 2013-10-25: 1 mg via INTRAVENOUS
  Filled 2013-10-25: qty 1

## 2013-10-25 NOTE — ED Notes (Addendum)
Pt with abd pain and vomiting since yesterday, also states she is having trouble breathing due to asthma, pt vomiting in triage, pt unable to describe her abd pain

## 2013-10-25 NOTE — ED Provider Notes (Signed)
CSN: [id number]     Arrival date & time 10/25/13  1219 History  This chart was scribed for Flint MelterElliott L Wentz, MD by Shari HeritageAisha Amuda, ED Scribe. The patient was seen in room APA12/APA12. Patient's care was started at 1:37 PM.  Chief Complaint  Patient presents with  . Emesis  . Abdominal Pain     The history is provided by the patient. No language interpreter was used.    HPI Comments: Mindy Clark is a 23 y.o. female who presents to the Emergency Department complaining of severe, intermittent, upper abdominal pain and moderate, emesis of bilious material onset yesterday. Patient intolerant of solids and liquids. She states that she has a recurrent history of vomiting with abdominal pain and states these episodes occur about 2-3 times a month and last for a couple of days. She denies associated diarrhea, back pain, fever, shortness of breath. She denies any sick contacts or suspect food intake. Patient has no history of cholecystectomy or other abdominal surgeries. Patient has a medical history of asthma.   Past Medical History  Diagnosis Date  . Asthma    History reviewed. No pertinent past surgical history. History reviewed. No pertinent family history. History  Substance Use Topics  . Smoking status: Current Every Day Smoker -- 0.25 packs/day    Types: Cigarettes  . Smokeless tobacco: Not on file  . Alcohol Use: No   OB History   Grav Para Term Preterm Abortions TAB SAB Ect Mult Living                 Review of Systems  Constitutional: Negative for fever and chills.  Respiratory: Negative for shortness of breath.   Cardiovascular: Negative for chest pain.  Gastrointestinal: Positive for vomiting and abdominal pain. Negative for diarrhea.  Genitourinary: Negative for dysuria, frequency and hematuria.  Musculoskeletal: Negative for back pain.  Skin: Negative for rash.  All other systems reviewed and are negative.   Allergies  Kiwi extract; Amoxicillin; and  Penicillins  Home Medications   Prior to Admission medications   Medication Sig Start Date End Date Taking? Authorizing Provider  albuterol (PROVENTIL HFA;VENTOLIN HFA) 108 (90 BASE) MCG/ACT inhaler Inhale 1-2 puffs into the lungs every 6 (six) hours as needed for wheezing or shortness of breath. 08/08/13   Sunnie NielsenBrian Opitz, MD  Fluticasone-Salmeterol (ADVAIR DISKUS) 250-50 MCG/DOSE AEPB Inhale 1 puff into the lungs 2 (two) times daily. 08/12/13   Renae FickleMackenzie Short, MD  HYDROcodone-acetaminophen (NORCO) 5-325 MG per tablet Take 1 tablet by mouth every 4 (four) hours as needed. 10/25/13   Flint MelterElliott L Wentz, MD  ondansetron (ZOFRAN) 8 MG tablet Take 1 tablet (8 mg total) by mouth every 8 (eight) hours as needed for nausea or vomiting. 10/25/13   Flint MelterElliott L Wentz, MD   Triage Vitals: BP 141/94  Pulse 100  Temp(Src) 97.8 F (36.6 C) (Oral)  Resp 22  Ht 5' (1.524 m)  Wt 308 lb (139.708 kg)  BMI 60.15 kg/m2  SpO2 95%  LMP 10/20/2013 Physical Exam  Nursing note and vitals reviewed. Constitutional: She is oriented to person, place, and time. She appears well-developed and well-nourished.  HENT:  Head: Normocephalic and atraumatic.  Right Ear: External ear normal.  Left Ear: External ear normal.  Eyes: Conjunctivae and EOM are normal. Pupils are equal, round, and reactive to light.  Neck: Normal range of motion and phonation normal. Neck supple.  Cardiovascular: Normal rate, regular rhythm, normal heart sounds and intact distal pulses.   Pulmonary/Chest: Effort  normal and breath sounds normal. She exhibits no bony tenderness.  Abdominal: Soft. There is tenderness. There is no rebound.  Moderate bilateral upper abdominal tenderness.  Genitourinary:  Bilateral CVA tenderness.   Musculoskeletal: Normal range of motion. She exhibits edema.  1+ edema of lower extremities bilaterally.   Neurological: She is alert and oriented to person, place, and time. No cranial nerve deficit or sensory deficit. She  exhibits normal muscle tone. Coordination normal.  Skin: Skin is warm, dry and intact.  Psychiatric: She has a normal mood and affect. Her behavior is normal. Judgment and thought content normal.    ED Course  Procedures (including critical care time) Medications  0.9 %  sodium chloride infusion (not administered)  sodium chloride 0.9 % bolus 2,000 mL (1,000 mLs Intravenous New Bag/Given 10/25/13 1407)  HYDROmorphone (DILAUDID) injection 1 mg (1 mg Intravenous Given 10/25/13 1406)  ondansetron (ZOFRAN) injection 4 mg (4 mg Intravenous Given 10/25/13 1406)    Patient Vitals for the past 24 hrs:  BP Temp Temp src Pulse Resp SpO2 Height Weight  10/25/13 1224 141/94 mmHg 97.8 F (36.6 C) Oral 100 22 95 % 5' (1.524 m) 308 lb (139.708 kg)     COORDINATION OF CARE: 1:44 PM- Patient informed of current plan for treatment and evaluation and agrees with plan at this time.   3:31 PM Reevaluation with update and discussion. After initial assessment and treatment, an updated evaluation reveals she feels better, and wants to try some oral fluids. WENTZ,ELLIOTT L    3:55 PM Reevaluation with update and discussion. After initial assessment and treatment, an updated evaluation reveals she is comfortable, now, tolerating oral fluids. Findings discussed with patient, all questions answered. WENTZ,ELLIOTT L   Labs Review Labs Reviewed  CBC WITH DIFFERENTIAL - Abnormal; Notable for the following:    RDW 16.7 (*)    Platelets 481 (*)    All other components within normal limits  COMPREHENSIVE METABOLIC PANEL - Abnormal; Notable for the following:    Glucose, Bld 125 (*)    Total Bilirubin <0.2 (*)    All other components within normal limits  PREGNANCY, URINE  URINALYSIS, ROUTINE W REFLEX MICROSCOPIC    Imaging Review [place] Abdomen Complete  10/25/2013   CLINICAL DATA:  Abdominal pain, nausea and vomiting  EXAM: ULTRASOUND ABDOMEN COMPLETE  COMPARISON:  None.  FINDINGS: Gallbladder:  Multiple  shadowing mobile gallstones are noted without gallbladder wall thickening, pericholecystic fluid, or sonographic Murphy sign.  Common bile duct:  Diameter: 4 mm  Liver:  No focal lesion identified. Within normal limits in parenchymal echogenicity.  IVC:  Normal in appearance.  Pancreas:  Visualized portion unremarkable.  Spleen:  Size and appearance within normal limits.  Right Kidney:  Length: 12.3 cm. Echogenicity within normal limits. No mass or hydronephrosis visualized.  Left Kidney:  Length: 11.9 cm. Echogenicity within normal limits. No mass or hydronephrosis visualized.  Abdominal aorta:  No proximal aneurysmal dilatation. Mid and distal aorta obscured by bowel gas.  Other findings:  None.  IMPRESSION: Gallstones without other sonographic evidence for acute cholecystitis. Normal exam otherwise without focal acute finding.   Electronically Signed   By: Christiana PellantGretchen  Green M.D.   On: 10/25/2013 14:59    MDM   Final diagnoses:  Nausea and vomiting, vomiting of unspecified type  Epigastric pain  Calculus of gallbladder without cholecystitis without obstruction    Nonspecific, nausea and vomiting without diarrhea. Incidental finding of uncomplicated gallstones. I doubt acute colitis, metabolic instability, serious bacterial  infection or impending vascular collapse.   Nursing Notes Reviewed/ Care Coordinated Applicable Imaging Reviewed Interpretation of Laboratory Data incorporated into ED treatment  The patient appears reasonably screened and/or stabilized for discharge and I doubt any other medical condition or other [facility] requiring further screening, evaluation, or treatment in the ED at this time prior to discharge.  Plan: Home Medications- Norco, Zofran; Home Treatments- gradually advance diet; return here if the recommended treatment, does not improve the symptoms; Recommended follow up- Gen. Surg.1 week to discuss gall stones, PCP prn   I personally performed the services described in this  documentation, which was scribed in my presence. The recorded information has been reviewed and is accurate.     Flint Melter, MD 10/25/13 [phone]

## 2013-10-25 NOTE — ED Notes (Signed)
Per doctor's request, patient can have a cup of ice water.

## 2013-10-25 NOTE — ED Notes (Signed)
Pt reports no nausea at this time

## 2013-10-25 NOTE — Discharge Instructions (Signed)
Gradually advance your diet, initially starting with clear liquids. Get an appointment with the surgeon (Dr. Lovell SheehanJenkins), to discuss your gallstones.    Abdominal Pain Many things can cause belly (abdominal) pain. Most times, the belly pain is not dangerous. Many cases of belly pain can be watched and treated at home. HOME CARE   Do not take medicines that help you go poop (laxatives) unless told to by your doctor.  Only take medicine as told by your doctor.  Eat or drink as told by your doctor. Your doctor will tell you if you should be on a special diet. GET HELP IF:  You do not know what is causing your belly pain.  You have belly pain while you are sick to your stomach (nauseous) or have runny poop (diarrhea).  You have pain while you pee or poop.  Your belly pain wakes you up at night.  You have belly pain that gets worse or better when you eat.  You have belly pain that gets worse when you eat fatty foods.  You have a fever. GET HELP RIGHT AWAY IF:   The pain does not go away within 2 hours.  You keep throwing up (vomiting).  The pain changes and is only in the right or left part of the belly.  You have bloody or tarry looking poop. MAKE SURE YOU:   Understand these instructions.  Will watch your condition.  Will get help right away if you are not doing well or get worse. Document Released: 10/06/2007 Document Revised: 04/24/2013 Document Reviewed: 12/27/2012 [facility] Patient Information 2015 [place], [place]. This information is not intended to replace advice given to you by your health care provider. Make sure you discuss any questions you have with your health care provider.  Cholelithiasis Cholelithiasis (also called gallstones) is a form of gallbladder disease. The gallbladder is a small organ that helps you digest fats. Symptoms of gallstones are:  Feeling sick to your stomach (nausea).  Throwing up (vomiting).  Belly pain.  Yellowing of the skin  (jaundice).  Sudden pain. You may feel the pain for minutes to hours.  Fever.  Pain to the touch. HOME CARE  Only take medicines as told by your doctor.  Eat a low-fat diet until you see your doctor again. Eating fat can result in pain.  Follow up with your doctor as told. Attacks usually happen time after time. Surgery is usually needed for permanent treatment. GET HELP RIGHT AWAY IF:   Your pain gets worse.  Your pain is not helped by medicines.  You have a fever and lasting symptoms for more than 2-3 days.  You have a fever and your symptoms suddenly get worse.  You keep feeling sick to your stomach and throwing up. MAKE SURE YOU:   Understand these instructions.  Will watch your condition.  Will get help right away if you are not doing well or get worse. Document Released: 10/06/2007 Document Revised: 12/20/2012 Document Reviewed: 10/11/2012 [facility]Exit[facility] Patient Information 2015 [place], [place]. This information is not intended to replace advice given to you by your health care provider. Make sure you discuss any questions you have with your health care provider.

## 2013-10-29 ENCOUNTER — Emergency Department (HOSPITAL_COMMUNITY)
Admission: EM | Admit: 2013-10-29 | Discharge: 2013-10-29 | Disposition: A | Discharge status: Home / Self Care | Payer: Medicare Other | Attending: Emergency Medicine | Admitting: Emergency Medicine

## 2013-10-29 ENCOUNTER — Encounter (HOSPITAL_COMMUNITY): Payer: Self-pay | Admitting: Emergency Medicine

## 2013-10-29 DIAGNOSIS — Z8719 Personal history of other diseases of the digestive system: Secondary | ICD-10-CM | POA: Insufficient documentation

## 2013-10-29 DIAGNOSIS — J45909 Unspecified asthma, uncomplicated: Secondary | ICD-10-CM | POA: Diagnosis not present

## 2013-10-29 DIAGNOSIS — F17200 Nicotine dependence, unspecified, uncomplicated: Secondary | ICD-10-CM | POA: Insufficient documentation

## 2013-10-29 DIAGNOSIS — Z79899 Other long term (current) drug therapy: Secondary | ICD-10-CM | POA: Insufficient documentation

## 2013-10-29 DIAGNOSIS — K8020 Calculus of gallbladder without cholecystitis without obstruction: Secondary | ICD-10-CM | POA: Diagnosis not present

## 2013-10-29 DIAGNOSIS — R110 Nausea: Secondary | ICD-10-CM | POA: Diagnosis not present

## 2013-10-29 DIAGNOSIS — K8050 Calculus of bile duct without cholangitis or cholecystitis without obstruction: Secondary | ICD-10-CM

## 2013-10-29 DIAGNOSIS — R109 Unspecified abdominal pain: Secondary | ICD-10-CM | POA: Diagnosis present

## 2013-10-29 DIAGNOSIS — Z880 Allergy status to penicillin: Secondary | ICD-10-CM | POA: Insufficient documentation

## 2013-10-29 HISTORY — DX: Calculus of gallbladder without cholecystitis without obstruction: K80.20

## 2013-10-29 LAB — COMPREHENSIVE METABOLIC PANEL
ALBUMIN: 3.6 g/dL (ref 3.5–5.2)
ALT: 17 U/L (ref 0–35)
AST: 14 U/L (ref 0–37)
Alkaline Phosphatase: 73 U/L (ref 39–117)
BUN: 8 mg/dL (ref 6–23)
CALCIUM: 9.6 mg/dL (ref 8.4–10.5)
CO2: 27 mEq/L (ref 19–32)
CREATININE: 0.63 mg/dL (ref 0.50–1.10)
Chloride: 99 mEq/L (ref 96–112)
GFR calc Af Amer: >90 mL/min (ref >90)
GFR calc non Af Amer: >90 mL/min (ref >90)
Glucose, Bld: 79 mg/dL (ref 70–99)
Potassium: 3.8 mEq/L (ref 3.7–5.3)
Sodium: 139 mEq/L (ref 137–147)
TOTAL PROTEIN: 8.1 g/dL (ref 6.0–8.3)
Total Bilirubin: 0.2 mg/dL — ABNORMAL LOW (ref 0.3–1.2)

## 2013-10-29 LAB — CBC WITH DIFFERENTIAL/PLATELET
BASOS ABS: 0 10*3/uL (ref 0.0–0.1)
Basophils Relative: 0 % (ref 0–1)
EOS ABS: 0.2 10*3/uL (ref 0.0–0.7)
Eosinophils Relative: 3 % (ref 0–5)
HEMATOCRIT: 35.4 % — AB (ref 36.0–46.0)
HEMOGLOBIN: 11.8 g/dL — AB (ref 12.0–15.0)
Lymphocytes Relative: 34 % (ref 12–46)
Lymphs Abs: 3.2 10*3/uL (ref 0.7–4.0)
MCH: 27 pg (ref 26.0–34.0)
MCHC: 33.3 g/dL (ref 30.0–36.0)
MCV: 81 fL (ref 78.0–100.0)
MONO ABS: 0.7 10*3/uL (ref 0.1–1.0)
MONOS PCT: 8 % (ref 3–12)
NEUTROS ABS: 5.1 10*3/uL (ref 1.7–7.7)
Neutrophils Relative %: 55 % (ref 43–77)
Platelets: 436 10*3/uL — ABNORMAL HIGH (ref 150–400)
RBC: 4.37 MIL/uL (ref 3.87–5.11)
RDW: 16.6 % — AB (ref 11.5–15.5)
WBC: 9.3 10*3/uL (ref 4.0–10.5)

## 2013-10-29 LAB — LIPASE, BLOOD: LIPASE: 15 U/L (ref 11–59)

## 2013-10-29 MED ORDER — ONDANSETRON 4 MG PO TBDP
4.0000 mg | ORAL_TABLET | Freq: Once | ORAL | Status: AC
  Administered 2013-10-29: 4 mg via ORAL
  Filled 2013-10-29: qty 1

## 2013-10-29 MED ORDER — OXYCODONE-ACETAMINOPHEN 10-325 MG PO TABS: 1.0000 | ORAL_TABLET | ORAL | Status: DC | PRN

## 2013-10-29 MED ORDER — HYDROMORPHONE HCL PF 1 MG/ML IJ SOLN: 1.0000 mg | INTRAMUSCULAR | Status: DC | PRN

## 2013-10-29 MED ORDER — ONDANSETRON 4 MG PO TBDP: 4.0000 mg | ORAL_TABLET | Freq: Three times a day (TID) | ORAL | Status: DC | PRN

## 2013-10-29 NOTE — Discharge Instructions (Signed)
Abdominal Pain  Many things can cause abdominal pain. Usually, abdominal pain is not caused by a disease and will improve without treatment. It can often be observed and treated at home. Your health care provider will do a physical exam and possibly order blood tests and X-rays to help determine the seriousness of your pain. However, in many cases, more time must pass before a clear cause of the pain can be found. Before that point, your health care provider may not know if you need more testing or further treatment.  HOME CARE INSTRUCTIONS   Monitor your abdominal pain for any changes. The following actions may help to alleviate any discomfort you are experiencing:   Only take over-the-counter or prescription medicines as directed by your health care provider.   Do not take laxatives unless directed to do so by your health care provider.   Try a clear liquid diet (broth, tea, or water) as directed by your health care provider. Slowly move to a bland diet as tolerated.  SEEK MEDICAL CARE IF:   You have unexplained abdominal pain.   You have abdominal pain associated with nausea or diarrhea.   You have pain when you urinate or have a bowel movement.   You experience abdominal pain that wakes you in the night.   You have abdominal pain that is worsened or improved by eating food.   You have abdominal pain that is worsened with eating fatty foods.   You have a fever.  SEEK IMMEDIATE MEDICAL CARE IF:    Your pain does not go away within 2 hours.   You keep throwing up (vomiting).   Your pain is felt only in portions of the abdomen, such as the right side or the left lower portion of the abdomen.   You pass bloody or black tarry stools.  MAKE SURE YOU:   Understand these instructions.    Will watch your condition.    Will get help right away if you are not doing well or get worse.   Document Released: 01/27/2005 Document Revised: 04/24/2013 Document Reviewed: 12/27/2012  [employer] Patient Information  2015 [employer]. This information is not intended to replace advice given to you by your health care provider. Make sure you discuss any questions you have with your health care provider.          Biliary Colic   Biliary colic is a steady or irregular pain in the upper abdomen. It is usually under the right side of the rib cage. It happens when gallstones interfere with the normal flow of bile from the gallbladder. Bile is a liquid that helps to digest fats. Bile is made in the liver and stored in the gallbladder. When you eat a meal, bile passes from the gallbladder through the cystic duct and the common bile duct into the small intestine. There, it mixes with partially digested food. If a gallstone blocks either of these ducts, the normal flow of bile is blocked. The muscle cells in the bile duct contract forcefully to try to move the stone. This causes the pain of biliary colic.   SYMPTOMS    A person with biliary colic usually complains of pain in the upper abdomen. This pain can be:   In the center of the upper abdomen just below the breastbone.   In the upper-right part of the abdomen, near the gallbladder and liver.   Spread back toward the right shoulder blade.   Nausea and vomiting.   The pain   usually occurs after eating.   Biliary colic is usually triggered by the digestive system's demand for bile. The demand for bile is high after fatty meals. Symptoms can also occur when a person who has been fasting suddenly eats a very large meal. Most episodes of biliary colic pass after 1 to 5 hours. After the most intense pain passes, your abdomen may continue to ache mildly for about 24 hours.  DIAGNOSIS   After you describe your symptoms, your caregiver will perform a physical exam. He or she will pay attention to the upper right portion of your belly (abdomen). This is the area of your liver and gallbladder. An ultrasound will help your caregiver look for gallstones. Specialized scans of the  gallbladder may also be done. Blood tests may be done, especially if you have fever or if your pain persists.  PREVENTION   Biliary colic can be prevented by controlling the risk factors for gallstones. Some of these risk factors, such as heredity, increasing age, and pregnancy are a normal part of life. Obesity and a high-fat diet are risk factors you can change through a healthy lifestyle. Women going through menopause who take hormone replacement therapy (estrogen) are also more likely to develop biliary colic.  TREATMENT    Pain medication may be prescribed.   You may be encouraged to eat a fat-free diet.   If the first episode of biliary colic is severe, or episodes of colic keep retuning, surgery to remove the gallbladder (cholecystectomy) is usually recommended. This procedure can be done through small incisions using an instrument called a laparoscope. The procedure often requires a brief stay in the hospital. Some people can leave the hospital the same day. It is the most widely used treatment in people troubled by painful gallstones. It is effective and safe, with no complications in more than 90% of cases.   If surgery cannot be done, medication that dissolves gallstones may be used. This medication is expensive and can take months or years to work. Only small stones will dissolve.   Rarely, medication to dissolve gallstones is combined with a procedure called shock-wave lithotripsy. This procedure uses carefully aimed shock waves to break up gallstones. In many people treated with this procedure, gallstones form again within a few years.  PROGNOSIS   If gallstones block your cystic duct or common bile duct, you are at risk for repeated episodes of biliary colic. There is also a 25% chance that you will develop a gallbladder infection(acute cholecystitis), or some other complication of gallstones within 10 to 20 years. If you have surgery, schedule it at a time that is convenient for you and at a  time when you are not sick.  HOME CARE INSTRUCTIONS    Drink plenty of clear fluids.   Avoid fatty, greasy or fried foods, or any foods that make your pain worse.   Take medications as directed.  SEEK MEDICAL CARE IF:    You develop a fever over 100.5 F (38.1 C).   Your pain gets worse over time.   You develop nausea that prevents you from eating and drinking.   You develop vomiting.  SEEK IMMEDIATE MEDICAL CARE IF:    You have continuous or severe belly (abdominal) pain which is not relieved with medications.   You develop nausea and vomiting which is not relieved with medications.   You have symptoms of biliary colic and you suddenly develop a fever and shaking chills. This may signal cholecystitis. Call your caregiver   immediately.   You develop a yellow color to your skin or the white part of your eyes (jaundice).  Document Released: 09/20/2005 Document Revised: 07/12/2011 Document Reviewed: 11/30/2007  [employer] Patient Information 2015 [employer]. This information is not intended to replace advice given to you by your health care provider. Make sure you discuss any questions you have with your health care provider.

## 2013-10-29 NOTE — ED Provider Notes (Signed)
CSN: [id number]     Arrival date & time 10/29/13  1809 History   First MD Initiated Contact with Patient 10/29/13 1825     Chief Complaint  Patient presents with  . Abdominal Pain      HPI  Patient presents with abdominal pain. Evaluated here 4 days ago diagnosed with cholelithiasis. Has appointment with Dr. Lovell SheehanJenkins in 2 days. Has been on for liquid diet. Had a sandwich this evening. Had some abdominal pain and nausea since that time. One episode of emesis. Slightly better after the emesis but some mid epigastric and right upper quadrant pain persists. No dark urine, no light stools. No back pain. Heme-negative nonbilious emesis. Having intermittent symptoms for over a month before her evaluation and diagnosis of her gallstones 4 days ago.  Past Medical History  Diagnosis Date  . Asthma   . Gall stones    History reviewed. No pertinent past surgical history. History reviewed. No pertinent family history. History  Substance Use Topics  . Smoking status: Current Every Day Smoker -- 0.25 packs/day    Types: Cigarettes  . Smokeless tobacco: Not on file  . Alcohol Use: No   OB History   Grav Para Term Preterm Abortions TAB SAB Ect Mult Living                 Review of Systems  Constitutional: Negative for fever, chills, diaphoresis, appetite change and fatigue.  HENT: Negative for mouth sores, sore throat and trouble swallowing.   Eyes: Negative for visual disturbance.  Respiratory: Negative for cough, chest tightness, shortness of breath and wheezing.   Cardiovascular: Negative for chest pain.  Gastrointestinal: Positive for nausea and abdominal pain. Negative for vomiting, diarrhea and abdominal distention.  Endocrine: Negative for polydipsia, polyphagia and polyuria.  Genitourinary: Negative for dysuria, frequency and hematuria.  Musculoskeletal: Negative for gait problem.  Skin: Negative for color change, pallor and rash.  Neurological: Negative for dizziness, syncope,  light-headedness and headaches.  Hematological: Does not bruise/bleed easily.  Psychiatric/Behavioral: Negative for behavioral problems and confusion.      Allergies  Kiwi extract; Amoxicillin; and Penicillins  Home Medications   Prior to Admission medications   Medication Sig Start Date End Date Taking? Authorizing Provider  albuterol (PROVENTIL HFA;VENTOLIN HFA) 108 (90 BASE) MCG/ACT inhaler Inhale 1-2 puffs into the lungs every 6 (six) hours as needed for wheezing or shortness of breath. 08/08/13  Yes Sunnie NielsenBrian Opitz, MD  Fluticasone-Salmeterol (ADVAIR DISKUS) 250-50 MCG/DOSE AEPB Inhale 1 puff into the lungs 2 (two) times daily. 08/12/13  Yes Renae FickleMackenzie Short, MD  HYDROcodone-acetaminophen (NORCO) 5-325 MG per tablet Take 1 tablet by mouth every 4 (four) hours as needed. 10/25/13  Yes Flint MelterElliott L Wentz, MD  ibuprofen (ADVIL,MOTRIN) 600 MG tablet Take 600 mg by mouth daily as needed. For pain 05/20/10  Yes Historical Provider, MD  montelukast (SINGULAIR) 10 MG tablet Take 10 mg by mouth at bedtime. 05/20/10  Yes Historical Provider, MD  ondansetron (ZOFRAN) 8 MG tablet Take 1 tablet (8 mg total) by mouth every 8 (eight) hours as needed for nausea or vomiting. 10/25/13  Yes Flint MelterElliott L Wentz, MD  ondansetron (ZOFRAN ODT) 4 MG disintegrating tablet Take 1 tablet (4 mg total) by mouth every 8 (eight) hours as needed for nausea. 10/29/13   Rolland PorterMark James, MD  oxyCODONE-acetaminophen (PERCOCET) 10-325 MG per tablet Take 1 tablet by mouth every 4 (four) hours as needed for pain. 10/29/13   Rolland PorterMark James, MD   BP 136/77  Pulse  106  Temp(Src) 97.9 F (36.6 C) (Oral)  Resp 24  Ht 4\' 11"  (1.499 m)  Wt 306 lb 4.8 oz (138.937 kg)  BMI 61.83 kg/m2  SpO2 97%  LMP 10/20/2013 Physical Exam  Constitutional: She is oriented to person, place, and time. She appears well-developed and well-nourished. No distress.  HENT:  Head: Normocephalic.  Eyes: Conjunctivae are normal. Pupils are equal, round, and reactive to light.  No scleral icterus.  Neck: Normal range of motion. Neck supple. No thyromegaly present.  Cardiovascular: Normal rate and regular rhythm.  Exam reveals no gallop and no friction rub.   No murmur heard. Pulmonary/Chest: Effort normal and breath sounds normal. No respiratory distress. She has no wheezes. She has no rales.  Abdominal: Soft. Bowel sounds are normal. She exhibits no distension. There is tenderness. There is no rebound.    Tenderness without guarding, rebound, or peritoneal irritation.  Musculoskeletal: Normal range of motion.  Neurological: She is alert and oriented to person, place, and time.  Skin: Skin is warm and dry. No rash noted.  Psychiatric: She has a normal mood and affect. Her behavior is normal.    ED Course  Procedures (including critical care time) Labs Review Labs Reviewed  CBC WITH DIFFERENTIAL - Abnormal; Notable for the following:    Hemoglobin 11.8 (*)    HCT 35.4 (*)    RDW 16.6 (*)    Platelets 436 (*)    All other components within normal limits  COMPREHENSIVE METABOLIC PANEL - Abnormal; Notable for the following:    Total Bilirubin 0.2 (*)    All other components within normal limits  LIPASE, BLOOD    Imaging Review No results found.   EKG Interpretation None      MDM   Final diagnoses:  Abdominal pain, unspecified abdominal location  Biliary colic    Normal hepatobiliary and pancreatic enzymes. Symptom relief. Plan is low-fat essentially clear liquid diet until follow up with general surgery, Dr. Lovell SheehanJenkins at her appointment on Wednesday.   Rolland PorterMark James, MD 10/29/13 2010

## 2013-10-29 NOTE — ED Notes (Signed)
Pt alert and cooperative.  Reporting improvement in nausea.  States that she does still have some abdominal pain.  Pt watching TV and talking on phone.   No visible distress noted at present.

## 2013-10-29 NOTE — ED Notes (Signed)
Upper abd pain, seen here 6/25 and dx with gall stones.  Has not seen Dr Lovell SheehanJenkins yet.  Abd pain and n/v

## 2013-11-07 ENCOUNTER — Encounter (HOSPITAL_COMMUNITY): Payer: Self-pay | Admitting: Emergency Medicine

## 2013-11-07 ENCOUNTER — Emergency Department (HOSPITAL_COMMUNITY)
Admission: EM | Admit: 2013-11-07 | Discharge: 2013-11-07 | Disposition: A | Discharge status: Home / Self Care | Payer: Medicare Other | Attending: Emergency Medicine | Admitting: Emergency Medicine

## 2013-11-07 DIAGNOSIS — Z880 Allergy status to penicillin: Secondary | ICD-10-CM | POA: Diagnosis not present

## 2013-11-07 DIAGNOSIS — K8020 Calculus of gallbladder without cholecystitis without obstruction: Secondary | ICD-10-CM | POA: Diagnosis not present

## 2013-11-07 DIAGNOSIS — F17200 Nicotine dependence, unspecified, uncomplicated: Secondary | ICD-10-CM | POA: Diagnosis not present

## 2013-11-07 DIAGNOSIS — Z791 Long term (current) use of non-steroidal anti-inflammatories (NSAID): Secondary | ICD-10-CM | POA: Insufficient documentation

## 2013-11-07 DIAGNOSIS — J45909 Unspecified asthma, uncomplicated: Secondary | ICD-10-CM | POA: Insufficient documentation

## 2013-11-07 DIAGNOSIS — IMO0002 Reserved for concepts with insufficient information to code with codable children: Secondary | ICD-10-CM | POA: Insufficient documentation

## 2013-11-07 DIAGNOSIS — K8000 Calculus of gallbladder with acute cholecystitis without obstruction: Secondary | ICD-10-CM | POA: Insufficient documentation

## 2013-11-07 DIAGNOSIS — R1013 Epigastric pain: Secondary | ICD-10-CM | POA: Diagnosis present

## 2013-11-07 DIAGNOSIS — Z79899 Other long term (current) drug therapy: Secondary | ICD-10-CM | POA: Insufficient documentation

## 2013-11-07 DIAGNOSIS — A5903 Trichomonal cystitis and urethritis: Secondary | ICD-10-CM

## 2013-11-07 DIAGNOSIS — A5909 Other urogenital trichomoniasis: Secondary | ICD-10-CM | POA: Diagnosis not present

## 2013-11-07 DIAGNOSIS — K8050 Calculus of bile duct without cholangitis or cholecystitis without obstruction: Secondary | ICD-10-CM

## 2013-11-07 LAB — CBC WITH DIFFERENTIAL/PLATELET
BASOS ABS: 0 10*3/uL (ref 0.0–0.1)
Basophils Relative: 0 % (ref 0–1)
EOS PCT: 3 % (ref 0–5)
Eosinophils Absolute: 0.3 10*3/uL (ref 0.0–0.7)
HEMATOCRIT: 36.1 % (ref 36.0–46.0)
HEMOGLOBIN: 11.8 g/dL — AB (ref 12.0–15.0)
LYMPHS ABS: 2.8 10*3/uL (ref 0.7–4.0)
LYMPHS PCT: 30 % (ref 12–46)
MCH: 26.6 pg (ref 26.0–34.0)
MCHC: 32.7 g/dL (ref 30.0–36.0)
MCV: 81.5 fL (ref 78.0–100.0)
MONO ABS: 0.7 10*3/uL (ref 0.1–1.0)
MONOS PCT: 7 % (ref 3–12)
NEUTROS ABS: 5.6 10*3/uL (ref 1.7–7.7)
Neutrophils Relative %: 60 % (ref 43–77)
Platelets: 395 10*3/uL (ref 150–400)
RBC: 4.43 MIL/uL (ref 3.87–5.11)
RDW: 16.8 % — ABNORMAL HIGH (ref 11.5–15.5)
WBC: 9.4 10*3/uL (ref 4.0–10.5)

## 2013-11-07 LAB — COMPREHENSIVE METABOLIC PANEL
ALT: 17 U/L (ref 0–35)
AST: 16 U/L (ref 0–37)
Albumin: 3.3 g/dL — ABNORMAL LOW (ref 3.5–5.2)
Alkaline Phosphatase: 67 U/L (ref 39–117)
Anion gap: 12 (ref 5–15)
BUN: 8 mg/dL (ref 6–23)
CHLORIDE: 101 meq/L (ref 96–112)
CO2: 24 mEq/L (ref 19–32)
CREATININE: 0.58 mg/dL (ref 0.50–1.10)
Calcium: 9.1 mg/dL (ref 8.4–10.5)
GFR calc non Af Amer: >90 mL/min (ref >90)
GLUCOSE: 196 mg/dL — AB (ref 70–99)
Potassium: 4.1 mEq/L (ref 3.7–5.3)
Sodium: 137 mEq/L (ref 137–147)
Total Protein: 7.8 g/dL (ref 6.0–8.3)

## 2013-11-07 LAB — URINALYSIS, ROUTINE W REFLEX MICROSCOPIC
BILIRUBIN URINE: NEGATIVE
GLUCOSE, UA: NEGATIVE mg/dL
KETONES UR: NEGATIVE mg/dL
Nitrite: NEGATIVE
PH: 5.5 (ref 5.0–8.0)
PROTEIN: NEGATIVE mg/dL
Specific Gravity, Urine: 1.015 (ref 1.005–1.030)
Urobilinogen, UA: 0.2 mg/dL (ref 0.0–1.0)

## 2013-11-07 LAB — URINE MICROSCOPIC-ADD ON

## 2013-11-07 LAB — LIPASE, BLOOD: Lipase: 20 U/L (ref 11–59)

## 2013-11-07 MED ORDER — FENTANYL CITRATE 0.05 MG/ML IJ SOLN
50.0000 ug | Freq: Once | INTRAMUSCULAR | Status: AC
  Administered 2013-11-07: 21:00:00 via INTRAVENOUS
  Filled 2013-11-07: qty 2

## 2013-11-07 MED ORDER — METOCLOPRAMIDE HCL 5 MG/ML IJ SOLN
10.0000 mg | Freq: Once | INTRAMUSCULAR | Status: AC
  Administered 2013-11-07: 10 mg via INTRAVENOUS
  Filled 2013-11-07: qty 2

## 2013-11-07 MED ORDER — METRONIDAZOLE 500 MG PO TABS
500.0000 mg | ORAL_TABLET | Freq: Two times a day (BID) | ORAL | Status: DC
Start: 2013-11-07 — End: 2013-12-18

## 2013-11-07 MED ORDER — FENTANYL CITRATE 0.05 MG/ML IJ SOLN
50.0000 ug | Freq: Once | INTRAMUSCULAR | Status: AC
  Administered 2013-11-07: 20:00:00 via INTRAVENOUS
  Filled 2013-11-07: qty 2

## 2013-11-07 MED ORDER — ONDANSETRON HCL 4 MG PO TABS: 4.0000 mg | ORAL_TABLET | Freq: Three times a day (TID) | ORAL | Status: DC | PRN

## 2013-11-07 MED ORDER — OXYCODONE-ACETAMINOPHEN 5-325 MG PO TABS: 1.0000 | ORAL_TABLET | Freq: Four times a day (QID) | ORAL | Status: DC | PRN

## 2013-11-07 MED ORDER — ONDANSETRON HCL 4 MG/2ML IJ SOLN
4.0000 mg | Freq: Once | INTRAMUSCULAR | Status: AC
  Administered 2013-11-07: 4 mg via INTRAVENOUS
  Filled 2013-11-07: qty 2

## 2013-11-07 MED ORDER — DIPHENHYDRAMINE HCL 50 MG/ML IJ SOLN
25.0000 mg | Freq: Once | INTRAMUSCULAR | Status: AC
  Administered 2013-11-07: 21:00:00 via INTRAVENOUS
  Filled 2013-11-07: qty 1

## 2013-11-07 MED ORDER — SODIUM CHLORIDE 0.9 % IV BOLUS (SEPSIS)
1000.0000 mL | Freq: Once | INTRAVENOUS | Status: AC
  Administered 2013-11-07: 1000 mL via INTRAVENOUS

## 2013-11-07 MED ORDER — SODIUM CHLORIDE 0.9 % IV SOLN
INTRAVENOUS | Status: DC
  Administered 2013-11-07: 19:00:00 via INTRAVENOUS

## 2013-11-07 NOTE — Discharge Instructions (Signed)
Avoid fried, high fat foods. These types of foods will make you have a gall bladder attack. Take the pain medication ONLY if you are having an attack of pain. Use the nausea medication as needed for nausea or vomiting. Keep your recheck appointment with Dr Lovell SheehanJenkins as discussed with him. Return to the ED if you get a fever or have uncontrolled pain or vomiting.      Biliary Colic  Biliary colic is a steady or irregular pain in the upper abdomen. It is usually under the right side of the rib cage. It happens when gallstones interfere with the normal flow of bile from the gallbladder. Bile is a liquid that helps to digest fats. Bile is made in the liver and stored in the gallbladder. When you eat a meal, bile passes from the gallbladder through the cystic duct and the common bile duct into the small intestine. There, it mixes with partially digested food. If a gallstone blocks either of these ducts, the normal flow of bile is blocked. The muscle cells in the bile duct contract forcefully to try to move the stone. This causes the pain of biliary colic.  SYMPTOMS   A person with biliary colic usually complains of pain in the upper abdomen. This pain can be:  In the center of the upper abdomen just below the breastbone.  In the upper-right part of the abdomen, near the gallbladder and liver.  Spread back toward the right shoulder blade.  Nausea and vomiting.  The pain usually occurs after eating.  Biliary colic is usually triggered by the digestive system's demand for bile. The demand for bile is high after fatty meals. Symptoms can also occur when a person who has been fasting suddenly eats a very large meal. Most episodes of biliary colic pass after 1 to 5 hours. After the most intense pain passes, your abdomen may continue to ache mildly for about 24 hours. DIAGNOSIS  After you describe your symptoms, your caregiver will perform a physical exam. He or she will pay attention to the upper right  portion of your belly (abdomen). This is the area of your liver and gallbladder. An ultrasound will help your caregiver look for gallstones. Specialized scans of the gallbladder may also be done. Blood tests may be done, especially if you have fever or if your pain persists. PREVENTION  Biliary colic can be prevented by controlling the risk factors for gallstones. Some of these risk factors, such as heredity, increasing age, and pregnancy are a normal part of life. Obesity and a high-fat diet are risk factors you can change through a healthy lifestyle. Women going through menopause who take hormone replacement therapy (estrogen) are also more likely to develop biliary colic. TREATMENT   Pain medication may be prescribed.  You may be encouraged to eat a fat-free diet.  If the first episode of biliary colic is severe, or episodes of colic keep retuning, surgery to remove the gallbladder (cholecystectomy) is usually recommended. This procedure can be done through small incisions using an instrument called a laparoscope. The procedure often requires a brief stay in the hospital. Some people can leave the hospital the same day. It is the most widely used treatment in people troubled by painful gallstones. It is effective and safe, with no complications in more than 90% of cases.  If surgery cannot be done, medication that dissolves gallstones may be used. This medication is expensive and can take months or years to work. Only small stones will dissolve.  Rarely, medication to dissolve gallstones is combined with a procedure called shock-wave lithotripsy. This procedure uses carefully aimed shock waves to break up gallstones. In many people treated with this procedure, gallstones form again within a few years. PROGNOSIS  If gallstones block your cystic duct or common bile duct, you are at risk for repeated episodes of biliary colic. There is also a 25% chance that you will develop a gallbladder  infection(acute cholecystitis), or some other complication of gallstones within 10 to 20 years. If you have surgery, schedule it at a time that is convenient for you and at a time when you are not sick. HOME CARE INSTRUCTIONS   Drink plenty of clear fluids.  Avoid fatty, greasy or fried foods, or any foods that make your pain worse.  Take medications as directed. SEEK MEDICAL CARE IF:   You develop a fever over 100.5 F (38.1 C).  Your pain gets worse over time.  You develop nausea that prevents you from eating and drinking.  You develop vomiting. SEEK IMMEDIATE MEDICAL CARE IF:   You have continuous or severe belly (abdominal) pain which is not relieved with medications.  You develop nausea and vomiting which is not relieved with medications.  You have symptoms of biliary colic and you suddenly develop a fever and shaking chills. This may signal cholecystitis. Call your caregiver immediately.  You develop a yellow color to your skin or the white part of your eyes (jaundice). Document Released: 09/20/2005 Document Revised: 07/12/2011 Document Reviewed: 11/30/2007 [facility] Patient Information 2015 [place]. This information is not intended to replace advice given to you by your health care provider. Make sure you discuss any questions you have with your health care provider.  Cholelithiasis Cholelithiasis (also called gallstones) is a form of gallbladder disease in which gallstones form in your gallbladder. The gallbladder is an organ that stores bile made in the liver, which helps digest fats. Gallstones begin as small crystals and slowly grow into stones. Gallstone pain occurs when the gallbladder spasms and a gallstone is blocking the duct. Pain can also occur when a stone passes out of the duct.  RISK FACTORS  Being female.   Having multiple pregnancies. Health care providers sometimes advise removing diseased gallbladders before future pregnancies.   Being  obese.  Eating a diet heavy in fried foods and fat.   Being older than 60 years and increasing age.   Prolonged use of medicines containing female hormones.   Having diabetes mellitus.   Rapidly losing weight.   Having a family history of gallstones (heredity).  SYMPTOMS  Nausea.   Vomiting.  Abdominal pain.   Yellowing of the skin (jaundice).   Sudden pain. It may persist from several minutes to several hours.  Fever.   Tenderness to the touch. In some cases, when gallstones do not move into the bile duct, people have no pain or symptoms. These are called "silent" gallstones.  TREATMENT Silent gallstones do not need treatment. In severe cases, emergency surgery may be required. Options for treatment include:  Surgery to remove the gallbladder. This is the most common treatment.  Medicines. These do not always work and may take 6-12 months or more to work.  Shock wave treatment (extracorporeal biliary lithotripsy). In this treatment an ultrasound machine sends shock waves to the gallbladder to break gallstones into smaller pieces that can pass into the intestines or be dissolved by medicine. HOME CARE INSTRUCTIONS   Only take over-the-counter or prescription medicines for pain, discomfort, or  fever as directed by your health care provider.   Follow a low-fat diet until seen again by your health care provider. Fat causes the gallbladder to contract, which can result in pain.   Follow up with your health care provider as directed. Attacks are almost always recurrent and surgery is usually required for permanent treatment.  SEEK IMMEDIATE MEDICAL CARE IF:   Your pain increases and is not controlled by medicines.   You have a fever or persistent symptoms for more than 2-3 days.   You have a fever and your symptoms suddenly get worse.   You have persistent nausea and vomiting.  MAKE SURE YOU:   Understand these instructions.  Will watch your  condition.  Will get help right away if you are not doing well or get worse. Document Released: 04/15/2005 Document Revised: 12/20/2012 Document Reviewed: 10/11/2012 [facility] Patient Information 2015 [place], [place]. This information is not intended to replace advice given to you by your health care provider. Make sure you discuss any questions you have with your health care provider.

## 2013-11-07 NOTE — ED Provider Notes (Signed)
CSN: [id number]     Arrival date & time 11/07/13  1814 History  This chart was scribed for Ward GivensIva L Keatin Benham, MD by Quintella ReichertMatthew Underwood, ED scribe.  This patient was seen in room APA07/APA07 and the patient's care was started at 7:02 PM.   Chief Complaint  Patient presents with  . Abdominal Pain    The history is provided by the patient. No language interpreter was used.    HPI Comments: Samule DryJorie N Pichardo is a 23 y.o. female with recently-diagnosed gallstones who presents to the Emergency Department complaining of abdominal pain that began yesterday.  Pt states she has had similar abdominal pain off-and-on for 2-3 months.  She was seen here about 2 weeks ago for a 2-day episode of abdominal pain with yellow emesis, and diagnosed with gall stones based on US.  She followed up with Dr. Leretha PolJenkin, surgery and was advised to wait and see if they would pass on their own.  Her current episode of pain came on again yesterday afternoon.  She had eaten ramen noodles, chicken, jello, and Propel water before her pain began.  Pain is localized to the epigastric abdomen and radiates to right upper abdomen.  It is described as constant, sharp and waxing-and-waning.  It does not radiate to the back.  She has also again developed similar vomiting with yellow emesis.  She vomited about 5 times yesterday and 2 times today. She has had mild diarrhea today. She denies fever.  Pt's next appointment with Dr. Lovell SheehanJenkins is in 2 weeks.  She has been off of her pain medications for 2 days. She has noticed that any type of fried food, fast food, or tacos will make the pain start. Pt quit smoking 4-5 months ago.  She drinks rarely.  She is on disability for asthma.    Pt has no PCP.  [facility]   Past Medical History  Diagnosis Date  . Asthma   . Gall stones     History reviewed. No pertinent past surgical history.  No family history on file.   History  Substance Use Topics  . Smoking status:  Current Every Day Smoker -- 0.25 packs/day    Types: Cigarettes  . Smokeless tobacco: Not on file  . Alcohol Use: No  on disability for asthma ETOH on occassions Quit smoking 4-5 months ago  OB History   Grav Para Term Preterm Abortions TAB SAB Ect Mult Living                   Review of Systems  Constitutional: Negative for fever.  Gastrointestinal: Positive for nausea, vomiting and abdominal pain.  All other systems reviewed and are negative.     Allergies  Kiwi extract; Amoxicillin; and Penicillins  Home Medications   Prior to Admission medications   Medication Sig Start Date End Date Taking? Authorizing Provider  albuterol (PROVENTIL HFA;VENTOLIN HFA) 108 (90 BASE) MCG/ACT inhaler Inhale 1-2 puffs into the lungs every 6 (six) hours as needed for wheezing or shortness of breath. 08/08/13   Sunnie NielsenBrian Opitz, MD  Fluticasone-Salmeterol (ADVAIR DISKUS) 250-50 MCG/DOSE AEPB Inhale 1 puff into the lungs 2 (two) times daily. 08/12/13   Renae FickleMackenzie Short, MD  HYDROcodone-acetaminophen (NORCO) 5-325 MG per tablet Take 1 tablet by mouth every 4 (four) hours as needed. 10/25/13   Flint MelterElliott L Wentz, MD  ibuprofen (ADVIL,MOTRIN) 600 MG tablet Take 600 mg by mouth daily as needed. For pain 05/20/10   Historical Provider,  MD  montelukast (SINGULAIR) 10 MG tablet Take 10 mg by mouth at bedtime. 05/20/10   Historical Provider, MD  ondansetron (ZOFRAN ODT) 4 MG disintegrating tablet Take 1 tablet (4 mg total) by mouth every 8 (eight) hours as needed for nausea. 10/29/13   Rolland Porter, MD  ondansetron (ZOFRAN) 8 MG tablet Take 1 tablet (8 mg total) by mouth every 8 (eight) hours as needed for nausea or vomiting. 10/25/13   Flint Melter, MD  oxyCODONE-acetaminophen (PERCOCET) 10-325 MG per tablet Take 1 tablet by mouth every 4 (four) hours as needed for pain. 10/29/13   Rolland Porter, MD   BP 137/98  Pulse 102  Temp(Src) 98 F (36.7 C) (Oral)  Resp 24  Ht 4\' 11"  (1.499 m)  Wt 306 lb (138.801 kg)  BMI  61.77 kg/m2  SpO2 98%  LMP 10/20/2013  Vital signs normal except tachycardia   Physical Exam  Nursing note and vitals reviewed. Constitutional: She is oriented to person, place, and time. She appears well-developed and well-nourished.  Non-toxic appearance. She does not appear ill. No distress.  HENT:  Head: Normocephalic and atraumatic.  Right Ear: External ear normal.  Left Ear: External ear normal.  Nose: Nose normal. No mucosal edema or rhinorrhea.  Mouth/Throat: Oropharynx is clear and moist and mucous membranes are normal. No dental abscesses or uvula swelling.  Eyes: Conjunctivae and EOM are normal. Pupils are equal, round, and reactive to light.  Neck: Normal range of motion and full passive range of motion without pain. Neck supple.  Cardiovascular: Normal rate, regular rhythm and normal heart sounds.  Exam reveals no gallop and no friction rub.   No murmur heard. Pulmonary/Chest: Effort normal and breath sounds normal. No respiratory distress. She has no wheezes. She has no rhonchi. She has no rales. She exhibits no tenderness and no crepitus.  Abdominal: Soft. Normal appearance and bowel sounds are normal. She exhibits no distension. There is tenderness in the right upper quadrant and epigastric area. There is no rebound and no guarding.    Musculoskeletal: Normal range of motion. She exhibits no edema and no tenderness.  Moves all extremities well.   Neurological: She is alert and oriented to person, place, and time. She has normal strength. No cranial nerve deficit.  Skin: Skin is warm, dry and intact. No rash noted. No erythema. No pallor.  Psychiatric: She has a normal mood and affect. Her speech is normal and behavior is normal. Her mood appears not anxious.    ED Course  Procedures (including critical care time)  Medications  0.9 %  sodium chloride infusion ( Intravenous New Bag/Given 11/07/13 1856)  sodium chloride 0.9 % bolus 1,000 mL (1,000 mLs Intravenous New  Bag/Given 11/07/13 1948)  fentaNYL (SUBLIMAZE) injection 50 mcg ( Intravenous Given 11/07/13 1947)  ondansetron (ZOFRAN) injection 4 mg (4 mg Intravenous Given 11/07/13 1947)  fentaNYL (SUBLIMAZE) injection 50 mcg ( Intravenous Given 11/07/13 2033)  metoCLOPramide (REGLAN) injection 10 mg (10 mg Intravenous Given 11/07/13 2030)  diphenhydrAMINE (BENADRYL) injection 25 mg ( Intravenous Given 11/07/13 2032)     DIAGNOSTIC STUDIES: Oxygen Saturation is 98% on room air, normal by my interpretation.    COORDINATION OF CARE: 7:10 PM-Discussed treatment plan which includes IV fluids, pain medication, anti-emetics, and labs with pt at bedside and pt agreed to plan.    Recheck at 2030 to discuss her test results. Patient's crying states she still having pain. More medication was ordered. We discussed her test results were normal.  Hopefully we'll get her pain controlled and she will be discharged to follow back up with Dr. Lovell Sheehan. Patient also reinforced to watch what she eats.  23:00 Pt is now painfree, ready to be discharged. We discussed watching her diet so she doesn't get another flare up of pain.   Labs Review Results for orders placed during the hospital encounter of 11/07/13  CBC WITH DIFFERENTIAL      Result Value Ref Range   WBC 9.4  4.0 - 10.5 K/uL   RBC 4.43  3.87 - 5.11 MIL/uL   Hemoglobin 11.8 (*) 12.0 - 15.0 g/dL   HCT 16.1  09.6 - 04.5 %   MCV 81.5  78.0 - 100.0 fL   MCH 26.6  26.0 - 34.0 pg   MCHC 32.7  30.0 - 36.0 g/dL   RDW 40.9 (*) 81.1 - 91.4 %   Platelets 395  150 - 400 K/uL   Neutrophils Relative % 60  43 - 77 %   Neutro Abs 5.6  1.7 - 7.7 K/uL   Lymphocytes Relative 30  12 - 46 %   Lymphs Abs 2.8  0.7 - 4.0 K/uL   Monocytes Relative 7  3 - 12 %   Monocytes Absolute 0.7  0.1 - 1.0 K/uL   Eosinophils Relative 3  0 - 5 %   Eosinophils Absolute 0.3  0.0 - 0.7 K/uL   Basophils Relative 0  0 - 1 %   Basophils Absolute 0.0  0.0 - 0.1 K/uL  COMPREHENSIVE METABOLIC PANEL       Result Value Ref Range   Sodium 137  137 - 147 mEq/L   Potassium 4.1  3.7 - 5.3 mEq/L   Chloride 101  96 - 112 mEq/L   CO2 24  19 - 32 mEq/L   Glucose, Bld 196 (*) 70 - 99 mg/dL   BUN 8  6 - 23 mg/dL   Creatinine, Ser 7.82  0.50 - 1.10 mg/dL   Calcium 9.1  8.4 - 95.6 mg/dL   Total Protein 7.8  6.0 - 8.3 g/dL   Albumin 3.3 (*) 3.5 - 5.2 g/dL   AST 16  0 - 37 U/L   ALT 17  0 - 35 U/L   Alkaline Phosphatase 67  39 - 117 U/L   Total Bilirubin <0.2 (*) 0.3 - 1.2 mg/dL   GFR calc non Af Amer >90  >90 mL/min   GFR calc Af Amer >90  >90 mL/min   Anion gap 12  5 - 15  LIPASE, BLOOD      Result Value Ref Range   Lipase 20  11 - 59 U/L  URINALYSIS, ROUTINE W REFLEX MICROSCOPIC      Result Value Ref Range   Color, Urine YELLOW  YELLOW   APPearance CLEAR  CLEAR   Specific Gravity, Urine 1.015  1.005 - 1.030   pH 5.5  5.0 - 8.0   Glucose, UA NEGATIVE  NEGATIVE mg/dL   Hgb urine dipstick SMALL (*) NEGATIVE   Bilirubin Urine NEGATIVE  NEGATIVE   Ketones, ur NEGATIVE  NEGATIVE mg/dL   Protein, ur NEGATIVE  NEGATIVE mg/dL   Urobilinogen, UA 0.2  0.0 - 1.0 mg/dL   Nitrite NEGATIVE  NEGATIVE   Leukocytes, UA SMALL (*) NEGATIVE  URINE MICROSCOPIC-ADD ON      Result Value Ref Range   Squamous Epithelial / LPF MANY (*) RARE   WBC, UA 0-2  <3 WBC/hpf   RBC / HPF 0-2  <  3 RBC/hpf   Bacteria, UA MANY (*) RARE   Urine-Other TRICHOMONAS PRESENT      Laboratory interpretation all normal except + trich   [place] Abdomen Complete  10/25/2013   CLINICAL DATA:  Abdominal pain, nausea and vomiting   IMPRESSION: Gallstones without other sonographic evidence for acute cholecystitis. Normal exam otherwise without focal acute finding.   Electronically Signed   By: Christiana PellantGretchen  Green M.D.   On: 10/25/2013 14:59      Imaging Review No results found.   EKG Interpretation None      MDM   Final diagnoses:  Gallstones  Biliary colic  Trichomonal cystitis   New Prescriptions   METRONIDAZOLE (FLAGYL)  500 MG TABLET    Take 1 tablet (500 mg total) by mouth 2 (two) times daily.   ONDANSETRON (ZOFRAN) 4 MG TABLET    Take 1 tablet (4 mg total) by mouth every 8 (eight) hours as needed for nausea or vomiting.   OXYCODONE-ACETAMINOPHEN (PERCOCET/ROXICET) 5-325 MG PER TABLET    Take 1 tablet by mouth every 6 (six) hours as needed for moderate pain or severe pain.    Plan discharge   Devoria AlbeIva Sevyn Paredez, MD, FACEP  I personally performed the services described in this documentation, which was scribed in my presence. The recorded information has been reviewed and considered.  Devoria AlbeIva Felicite Zeimet, MD, Armando GangFACEP   Ward GivensIva L Varnell Orvis, MD 11/07/13 [phone]

## 2013-11-07 NOTE — ED Notes (Signed)
Pt c/o abd pain and vomiting x 2 days.  Reports has gall stones.    Has been taking phenergan prn, last dose was 0930.

## 2013-11-21 ENCOUNTER — Emergency Department (HOSPITAL_COMMUNITY)
Admission: EM | Admit: 2013-11-21 | Discharge: 2013-11-21 | Disposition: A | Discharge status: Home / Self Care | Payer: Medicare Other | Attending: Emergency Medicine | Admitting: Emergency Medicine

## 2013-11-21 ENCOUNTER — Encounter (HOSPITAL_COMMUNITY): Payer: Self-pay | Admitting: Emergency Medicine

## 2013-11-21 DIAGNOSIS — F17200 Nicotine dependence, unspecified, uncomplicated: Secondary | ICD-10-CM | POA: Insufficient documentation

## 2013-11-21 DIAGNOSIS — Z79899 Other long term (current) drug therapy: Secondary | ICD-10-CM | POA: Diagnosis not present

## 2013-11-21 DIAGNOSIS — Z880 Allergy status to penicillin: Secondary | ICD-10-CM | POA: Insufficient documentation

## 2013-11-21 DIAGNOSIS — E669 Obesity, unspecified: Secondary | ICD-10-CM | POA: Diagnosis not present

## 2013-11-21 DIAGNOSIS — J45909 Unspecified asthma, uncomplicated: Secondary | ICD-10-CM | POA: Insufficient documentation

## 2013-11-21 DIAGNOSIS — K8020 Calculus of gallbladder without cholecystitis without obstruction: Secondary | ICD-10-CM | POA: Diagnosis not present

## 2013-11-21 DIAGNOSIS — R109 Unspecified abdominal pain: Secondary | ICD-10-CM | POA: Diagnosis present

## 2013-11-21 LAB — COMPREHENSIVE METABOLIC PANEL
ALT: 19 U/L (ref 0–35)
AST: 14 U/L (ref 0–37)
Albumin: 3.5 g/dL (ref 3.5–5.2)
Alkaline Phosphatase: 81 U/L (ref 39–117)
Anion gap: 12 (ref 5–15)
BUN: 8 mg/dL (ref 6–23)
CO2: 25 mEq/L (ref 19–32)
Calcium: 9 mg/dL (ref 8.4–10.5)
Chloride: 102 mEq/L (ref 96–112)
Creatinine, Ser: 0.61 mg/dL (ref 0.50–1.10)
GFR calc Af Amer: >90 mL/min (ref >90)
GFR calc non Af Amer: >90 mL/min (ref >90)
Glucose, Bld: 96 mg/dL (ref 70–99)
Potassium: 3.7 mEq/L (ref 3.7–5.3)
Sodium: 139 mEq/L (ref 137–147)
Total Bilirubin: 0.2 mg/dL — ABNORMAL LOW (ref 0.3–1.2)
Total Protein: 7.9 g/dL (ref 6.0–8.3)

## 2013-11-21 LAB — LIPASE, BLOOD: Lipase: 15 U/L (ref 11–59)

## 2013-11-21 LAB — CBC WITH DIFFERENTIAL/PLATELET
Basophils Absolute: 0 10*3/uL (ref 0.0–0.1)
Basophils Relative: 0 % (ref 0–1)
Eosinophils Absolute: 0.3 10*3/uL (ref 0.0–0.7)
Eosinophils Relative: 4 % (ref 0–5)
HCT: 34.6 % — ABNORMAL LOW (ref 36.0–46.0)
Hemoglobin: 11.1 g/dL — ABNORMAL LOW (ref 12.0–15.0)
Lymphocytes Relative: 35 % (ref 12–46)
Lymphs Abs: 2.5 10*3/uL (ref 0.7–4.0)
MCH: 26 pg (ref 26.0–34.0)
MCHC: 32.1 g/dL (ref 30.0–36.0)
MCV: 81 fL (ref 78.0–100.0)
Monocytes Absolute: 0.4 10*3/uL (ref 0.1–1.0)
Monocytes Relative: 6 % (ref 3–12)
Neutro Abs: 4 10*3/uL (ref 1.7–7.7)
Neutrophils Relative %: 55 % (ref 43–77)
Platelets: 446 10*3/uL — ABNORMAL HIGH (ref 150–400)
RBC: 4.27 MIL/uL (ref 3.87–5.11)
RDW: 16.6 % — ABNORMAL HIGH (ref 11.5–15.5)
WBC: 7.3 10*3/uL (ref 4.0–10.5)

## 2013-11-21 MED ORDER — ONDANSETRON HCL 4 MG/2ML IJ SOLN
4.0000 mg | Freq: Once | INTRAMUSCULAR | Status: AC
  Administered 2013-11-21: 4 mg via INTRAMUSCULAR
  Filled 2013-11-21: qty 2

## 2013-11-21 MED ORDER — HYDROMORPHONE HCL PF 1 MG/ML IJ SOLN
1.0000 mg | Freq: Once | INTRAMUSCULAR | Status: AC
  Administered 2013-11-21: 1 mg via INTRAVENOUS
  Filled 2013-11-21: qty 1

## 2013-11-21 MED ORDER — OXYCODONE-ACETAMINOPHEN 5-325 MG PO TABS: 1.0000 | ORAL_TABLET | ORAL | Status: DC | PRN

## 2013-11-21 MED ORDER — ONDANSETRON HCL 4 MG PO TABS: 4.0000 mg | ORAL_TABLET | Freq: Four times a day (QID) | ORAL | Status: DC

## 2013-11-21 MED ORDER — SODIUM CHLORIDE 0.9 % IV BOLUS (SEPSIS)
1000.0000 mL | Freq: Once | INTRAVENOUS | Status: AC
  Administered 2013-11-21: 1000 mL via INTRAVENOUS

## 2013-11-21 NOTE — ED Provider Notes (Signed)
CSN: [id number]     Arrival date & time 11/21/13  1422 History   First MD Initiated Contact with Patient 11/21/13 1525     Chief Complaint  Patient presents with  . Abdominal Pain     (Consider location/radiation/quality/duration/timing/severity/associated sxs/prior Treatment) HPI  23y female with abdominal pain. Patient has a known history of cholelithiasis. She reports return of symptoms late last night. Pain in the epigastrium and right upper quadrant. Has waxed and waned since returning last night. Says he would nausea. She did vomit once last night but none since then. Patient was evaluated in the emergency room for the same complaints. She had an appointment with Dr. Lovell Sheehan, general surgery, yesterday but missed it because of transportation issues. She is out of her previously prescribed pain medicine. She did have some remaining Zofran which she took last night and help with her nausea.    Past Medical History  Diagnosis Date  . Asthma   . Gall stones    History reviewed. No pertinent past surgical history. No family history on file. History  Substance Use Topics  . Smoking status: Current Every Day Smoker -- 0.25 packs/day    Types: Cigarettes  . Smokeless tobacco: Not on file  . Alcohol Use: No   OB History   Grav Para Term Preterm Abortions TAB SAB Ect Mult Living                 Review of Systems  All systems reviewed and negative, other than as noted in HPI.   Allergies  Kiwi extract; Amoxicillin; and Penicillins  Home Medications   Prior to Admission medications   Medication Sig Start Date End Date Taking? Authorizing Provider  albuterol (PROVENTIL HFA;VENTOLIN HFA) 108 (90 BASE) MCG/ACT inhaler Inhale 1-2 puffs into the lungs every 6 (six) hours as needed for wheezing or shortness of breath. 08/08/13  Yes Sunnie Nielsen, MD  montelukast (SINGULAIR) 10 MG tablet Take 10 mg by mouth daily as needed (for shortness of breath).  05/20/10  Yes Historical Provider, MD   ondansetron (ZOFRAN) 8 MG tablet Take 8 mg by mouth daily as needed for nausea or vomiting.   Yes Historical Provider, MD  metroNIDAZOLE (FLAGYL) 500 MG tablet Take 1 tablet (500 mg total) by mouth 2 (two) times daily. 11/07/13   Ward Givens, MD  oxyCODONE-acetaminophen (PERCOCET/ROXICET) 5-325 MG per tablet Take 1 tablet by mouth every 6 (six) hours as needed for moderate pain or severe pain. 11/07/13   Ward Givens, MD   BP 144/92  Pulse 91  Temp(Src) 98 F (36.7 C) (Oral)  Resp 18  Ht 4\' 11"  (1.499 m)  Wt 303 lb (137.44 kg)  BMI 61.17 kg/m2  SpO2 97%  LMP 10/17/2013 Physical Exam  Nursing note and vitals reviewed. Constitutional: No distress.  Laying in bed. NAD. Obese.   HENT:  Head: Normocephalic and atraumatic.  Eyes: Conjunctivae are normal. Right eye exhibits no discharge. Left eye exhibits no discharge.  Neck: Neck supple.  Cardiovascular: Normal rate, regular rhythm and normal heart sounds.  Exam reveals no gallop and no friction rub.   No murmur heard. Pulmonary/Chest: Effort normal and breath sounds normal. No respiratory distress.  Abdominal: Soft. She exhibits no distension. There is tenderness.  Tenderness in epigastrium and RUQ w/o rebound or guarding  Genitourinary:  No cva tenderness  Musculoskeletal: She exhibits no edema and no tenderness.  Neurological: She is alert.  Skin: Skin is warm and dry.  Psychiatric: She has  a normal mood and affect. Her behavior is normal. Thought content normal.    ED Course  Procedures (including critical care time) Labs Review Labs Reviewed  COMPREHENSIVE METABOLIC PANEL - Abnormal; Notable for the following:    Total Bilirubin 0.2 (*)    All other components within normal limits  CBC WITH DIFFERENTIAL - Abnormal; Notable for the following:    Hemoglobin 11.1 (*)    HCT 34.6 (*)    RDW 16.6 (*)    Platelets 446 (*)    All other components within normal limits  LIPASE, BLOOD    Imaging Review No results found.   EKG  Interpretation None      MDM   Final diagnoses:  Symptomatic cholelithiasis    23 year old female with abdominal pain. Hx of cholelithiasis. Reports similarity of her current symptoms to prior bouts of this. She does have some tenderness on exam, but she is not peritoneal. Afebrile. HD stable. Will check LFTs and lipase. Symptomatic treatment otherwise.   LFTs ok. Lipase normal. Feeling better. Pt frustrated that surgeon won't be evaluating her today, yet she missed her appointment with surgery yesterday. No indication for emergent evaluation. Her next appointment has been scheduled. Will DC with meds. Return precautions discussed.     Raeford RazorStephen Shelby Anderle, MD 11/22/13 [phone]

## 2013-11-21 NOTE — ED Notes (Signed)
States she was scheduled to see Dr Lovell SheehanJenkins yesterday regarding her gallbladder and was unable to keep that appointment due to lack of transportation, states she was advised to come to the ED per his office

## 2013-11-21 NOTE — Discharge Instructions (Signed)
Biliary Colic  °Biliary colic is a steady or irregular pain in the upper abdomen. It is usually under the right side of the rib cage. It happens when gallstones interfere with the normal flow of bile from the gallbladder. Bile is a liquid that helps to digest fats. Bile is made in the liver and stored in the gallbladder. When you eat a meal, bile passes from the gallbladder through the cystic duct and the common bile duct into the small intestine. There, it mixes with partially digested food. If a gallstone blocks either of these ducts, the normal flow of bile is blocked. The muscle cells in the bile duct contract forcefully to try to move the stone. This causes the pain of biliary colic.  °SYMPTOMS  °· A person with biliary colic usually complains of pain in the upper abdomen. This pain can be: °¨ In the center of the upper abdomen just below the breastbone. °¨ In the upper-right part of the abdomen, near the gallbladder and liver. °¨ Spread back toward the right shoulder blade. °· Nausea and vomiting. °· The pain usually occurs after eating. °· Biliary colic is usually triggered by the digestive system's demand for bile. The demand for bile is high after fatty meals. Symptoms can also occur when a person who has been fasting suddenly eats a very large meal. Most episodes of biliary colic pass after 1 to 5 hours. After the most intense pain passes, your abdomen may continue to ache mildly for about 24 hours. °DIAGNOSIS  °After you describe your symptoms, your caregiver will perform a physical exam. He or she will pay attention to the upper right portion of your belly (abdomen). This is the area of your liver and gallbladder. An ultrasound will help your caregiver look for gallstones. Specialized scans of the gallbladder may also be done. Blood tests may be done, especially if you have fever or if your pain persists. °PREVENTION  °Biliary colic can be prevented by controlling the risk factors for gallstones. Some of  these risk factors, such as heredity, increasing age, and pregnancy are a normal part of life. Obesity and a high-fat diet are risk factors you can change through a healthy lifestyle. Women going through menopause who take hormone replacement therapy (estrogen) are also more likely to develop biliary colic. °TREATMENT  °· Pain medication may be prescribed. °· You may be encouraged to eat a fat-free diet. °· If the first episode of biliary colic is severe, or episodes of colic keep retuning, surgery to remove the gallbladder (cholecystectomy) is usually recommended. This procedure can be done through small incisions using an instrument called a laparoscope. The procedure often requires a brief stay in the hospital. Some people can leave the hospital the same day. It is the most widely used treatment in people troubled by painful gallstones. It is effective and safe, with no complications in more than 90% of cases. °· If surgery cannot be done, medication that dissolves gallstones may be used. This medication is expensive and can take months or years to work. Only small stones will dissolve. °· Rarely, medication to dissolve gallstones is combined with a procedure called shock-wave lithotripsy. This procedure uses carefully aimed shock waves to break up gallstones. In many people treated with this procedure, gallstones form again within a few years. °PROGNOSIS  °If gallstones block your cystic duct or common bile duct, you are at risk for repeated episodes of biliary colic. There is also a 25% chance that you will develop   a gallbladder infection(acute cholecystitis), or some other complication of gallstones within 10 to 20 years. If you have surgery, schedule it at a time that is convenient for you and at a time when you are not sick. °HOME CARE INSTRUCTIONS  °· Drink plenty of clear fluids. °· Avoid fatty, greasy or fried foods, or any foods that make your pain worse. °· Take medications as directed. °SEEK MEDICAL  CARE IF:  °· You develop a fever over 100.5° F (38.1° C). °· Your pain gets worse over time. °· You develop nausea that prevents you from eating and drinking. °· You develop vomiting. °SEEK IMMEDIATE MEDICAL CARE IF:  °· You have continuous or severe belly (abdominal) pain which is not relieved with medications. °· You develop nausea and vomiting which is not relieved with medications. °· You have symptoms of biliary colic and you suddenly develop a fever and shaking chills. This may signal cholecystitis. Call your caregiver immediately. °· You develop a yellow color to your skin or the white part of your eyes (jaundice). °Document Released: 09/20/2005 Document Revised: 07/12/2011 Document Reviewed: 11/30/2007 °ExitCare® Patient Information ©2015 [employer]. This information is not intended to replace advice given to you by your health care provider. Make sure you discuss any questions you have with your health care provider. ° °

## 2013-11-26 ENCOUNTER — Encounter (HOSPITAL_COMMUNITY): Payer: Self-pay | Admitting: Emergency Medicine

## 2013-11-26 ENCOUNTER — Emergency Department (HOSPITAL_COMMUNITY)
Admission: EM | Admit: 2013-11-26 | Discharge: 2013-11-27 | Disposition: A | Discharge status: Home / Self Care | Payer: Medicare Other | Attending: Emergency Medicine | Admitting: Emergency Medicine

## 2013-11-26 DIAGNOSIS — F17200 Nicotine dependence, unspecified, uncomplicated: Secondary | ICD-10-CM | POA: Insufficient documentation

## 2013-11-26 DIAGNOSIS — J45909 Unspecified asthma, uncomplicated: Secondary | ICD-10-CM | POA: Insufficient documentation

## 2013-11-26 DIAGNOSIS — IMO0002 Reserved for concepts with insufficient information to code with codable children: Secondary | ICD-10-CM | POA: Diagnosis present

## 2013-11-26 DIAGNOSIS — Z8719 Personal history of other diseases of the digestive system: Secondary | ICD-10-CM | POA: Insufficient documentation

## 2013-11-26 DIAGNOSIS — Z880 Allergy status to penicillin: Secondary | ICD-10-CM | POA: Insufficient documentation

## 2013-11-26 DIAGNOSIS — Z792 Long term (current) use of antibiotics: Secondary | ICD-10-CM | POA: Insufficient documentation

## 2013-11-26 DIAGNOSIS — E669 Obesity, unspecified: Secondary | ICD-10-CM | POA: Insufficient documentation

## 2013-11-26 DIAGNOSIS — Z79899 Other long term (current) drug therapy: Secondary | ICD-10-CM | POA: Insufficient documentation

## 2013-11-26 DIAGNOSIS — L02411 Cutaneous abscess of right axilla: Secondary | ICD-10-CM

## 2013-11-26 NOTE — ED Notes (Signed)
Pt c/o abscesses to both armpits.

## 2013-11-27 DIAGNOSIS — IMO0002 Reserved for concepts with insufficient information to code with codable children: Secondary | ICD-10-CM | POA: Diagnosis not present

## 2013-11-27 MED ORDER — CLINDAMYCIN HCL 150 MG PO CAPS
300.0000 mg | ORAL_CAPSULE | Freq: Once | ORAL | Status: AC
  Administered 2013-11-27: 300 mg via ORAL
  Filled 2013-11-27: qty 2

## 2013-11-27 MED ORDER — LIDOCAINE HCL (PF) 2 % IJ SOLN
10.0000 mL | Freq: Once | INTRAMUSCULAR | Status: AC
  Administered 2013-11-27: 10 mL via INTRADERMAL
  Filled 2013-11-27: qty 10

## 2013-11-27 MED ORDER — CLINDAMYCIN HCL 300 MG PO CAPS: 300.0000 mg | ORAL_CAPSULE | Freq: Four times a day (QID) | ORAL | Status: DC

## 2013-11-27 MED ORDER — OXYCODONE-ACETAMINOPHEN 5-325 MG PO TABS
1.0000 | ORAL_TABLET | Freq: Once | ORAL | Status: AC
  Administered 2013-11-27: 1 via ORAL
  Filled 2013-11-27: qty 1

## 2013-11-27 NOTE — Discharge Instructions (Signed)
Abscess °An abscess (boil or furuncle) is an infected area on or under the skin. This area is filled with yellowish-white fluid (pus) and other material (debris). °HOME CARE  °· Only take medicines as told by your doctor. °· If you were given antibiotic medicine, take it as directed. Finish the medicine even if you start to feel better. °· If gauze is used, follow your doctor's directions for changing the gauze. °· To avoid spreading the infection: °· Keep your abscess covered with a bandage. °· Wash your hands well. °· Do not share personal care items, towels, or whirlpools with others. °· Avoid skin contact with others. °· Keep your skin and clothes clean around the abscess. °· Keep all doctor visits as told. °GET HELP RIGHT AWAY IF:  °· You have more pain, puffiness (swelling), or redness in the wound site. °· You have more fluid or blood coming from the wound site. °· You have muscle aches, chills, or you feel sick. °· You have a fever. °MAKE SURE YOU:  °· Understand these instructions. °· Will watch your condition. °· Will get help right away if you are not doing well or get worse. °Document Released: 10/06/2007 Document Revised: 10/19/2011 Document Reviewed: 07/02/2011 °ExitCare® Patient Information ©2015 [employer]. This information is not intended to replace advice given to you by your health care provider. Make sure you discuss any questions you have with your health care provider. ° °Abscess °Care After °An abscess (also called a boil or furuncle) is an infected area that contains a collection of pus. Signs and symptoms of an abscess include pain, tenderness, redness, or hardness, or you may feel a moveable soft area under your skin. An abscess can occur anywhere in the body. The infection may spread to surrounding tissues causing cellulitis. A cut (incision) by the surgeon was made over your abscess and the pus was drained out. Gauze may have been packed into the space to provide a drain that will  allow the cavity to heal from the inside outwards. The boil may be painful for 5 to 7 days. Most people with a boil do not have high fevers. Your abscess, if seen early, may not have localized, and may not have been lanced. If not, another appointment may be required for this if it does not get better on its own or with medications. °HOME CARE INSTRUCTIONS  °· Only take over-the-counter or prescription medicines for pain, discomfort, or fever as directed by your caregiver. °· When you bathe, soak and then remove gauze or iodoform packs at least daily or as directed by your caregiver. You may then wash the wound gently with mild soapy water. Repack with gauze or do as your caregiver directs. °SEEK IMMEDIATE MEDICAL CARE IF:  °· You develop increased pain, swelling, redness, drainage, or bleeding in the wound site. °· You develop signs of generalized infection including muscle aches, chills, fever, or a general ill feeling. °· An oral temperature above 102° F (38.9° C) develops, not controlled by medication. °See your caregiver for a recheck if you develop any of the symptoms described above. If medications (antibiotics) were prescribed, take them as directed. °Document Released: 11/05/2004 Document Revised: 07/12/2011 Document Reviewed: 07/03/2007 °ExitCare® Patient Information ©2015 [employer]. This information is not intended to replace advice given to you by your health care provider. Make sure you discuss any questions you have with your health care provider. ° °

## 2013-11-27 NOTE — ED Provider Notes (Signed)
Medical screening examination/treatment/procedure(s) were performed by non-physician practitioner and as supervising physician I was immediately available for consultation/collaboration.    Dione Boozeavid Dorsey Charette, MD 11/27/13 [phone]

## 2013-11-27 NOTE — ED Provider Notes (Signed)
CSN: [id number]     Arrival date & time 11/26/13  2350 History   First MD Initiated Contact with Patient 11/27/13 0040     Chief Complaint  Patient presents with  . Abscess     (Consider location/radiation/quality/duration/timing/severity/associated sxs/prior Treatment  Mindy Clark is a 23 y.o. female who presents to the Emergency Department complaining of abscess to both arm pits.  She c/o pain for several days.  Right underarm is more painful than left.  Has h/o frequent "boils".  She denies fever, chills or red streaking.     Patient is a 23 y.o. female presenting with abscess. The history is provided by the patient.  Abscess Location:  Shoulder/arm Shoulder/arm abscess location:  R axilla Size:  3 Abscess quality: fluctuance, induration, painful and redness   Red streaking: no   Duration:  4 days Progression:  Worsening Pain details:    Quality:  Throbbing and aching   Severity:  Severe   Timing:  Constant   Progression:  Worsening Chronicity:  Recurrent Context: not diabetes   Relieved by:  Nothing Worsened by:  Nothing tried Ineffective treatments:  None tried Associated symptoms: no fever, no headaches, no nausea and no vomiting   Risk factors: prior abscess     Past Medical History  Diagnosis Date  . Asthma   . Gall stones    History reviewed. No pertinent past surgical history. History reviewed. No pertinent family history. History  Substance Use Topics  . Smoking status: Current Some Day Smoker -- 0.25 packs/day    Types: Cigarettes  . Smokeless tobacco: Not on file  . Alcohol Use: No   OB History   Grav Para Term Preterm Abortions TAB SAB Ect Mult Living                 Review of Systems  Constitutional: Negative for fever and chills.  Gastrointestinal: Negative for nausea and vomiting.  Musculoskeletal: Negative for arthralgias and joint swelling.  Skin: Positive for color change.       Abscess to right underarm  Neurological: Negative for  headaches.  Hematological: Negative for adenopathy.  All other systems reviewed and are negative.     Allergies  Kiwi extract; Amoxicillin; and Penicillins  Home Medications   Prior to Admission medications   Medication Sig Start Date End Date Taking? Authorizing Provider  albuterol (PROVENTIL HFA;VENTOLIN HFA) 108 (90 BASE) MCG/ACT inhaler Inhale 1-2 puffs into the lungs every 6 (six) hours as needed for wheezing or shortness of breath. 08/08/13  Yes Sunnie Nielsen, MD  metroNIDAZOLE (FLAGYL) 500 MG tablet Take 1 tablet (500 mg total) by mouth 2 (two) times daily. 11/07/13  Yes Ward Givens, MD  montelukast (SINGULAIR) 10 MG tablet Take 10 mg by mouth daily as needed (for shortness of breath).  05/20/10  Yes Historical Provider, MD  ondansetron (ZOFRAN) 4 MG tablet Take 1 tablet (4 mg total) by mouth every 6 (six) hours. 11/21/13  Yes Raeford Razor, MD  ondansetron (ZOFRAN) 8 MG tablet Take 8 mg by mouth daily as needed for nausea or vomiting.   Yes Historical Provider, MD  oxyCODONE-acetaminophen (PERCOCET/ROXICET) 5-325 MG per tablet Take 1 tablet by mouth every 6 (six) hours as needed for moderate pain or severe pain. 11/07/13  Yes Ward Givens, MD  oxyCODONE-acetaminophen (PERCOCET/ROXICET) 5-325 MG per tablet Take 1-2 tablets by mouth every 4 (four) hours as needed for severe pain. 11/21/13  Yes Raeford Razor, MD   BP 132/92  Pulse  101  Temp(Src) 98 F (36.7 C)  Resp 17  Ht 4\' 11"  (1.499 m)  Wt 307 lb (139.254 kg)  BMI 61.97 kg/m2  SpO2 99%  LMP 10/18/2013 Physical Exam  Nursing note and vitals reviewed. Constitutional: She is oriented to person, place, and time. No distress.  NAD.  Pt is obese  HENT:  Head: Normocephalic and atraumatic.  Cardiovascular: Normal rate, regular rhythm, normal heart sounds and intact distal pulses.   No murmur heard. Pulmonary/Chest: Effort normal. No respiratory distress.  Musculoskeletal: Normal range of motion.  Neurological: She is alert and  oriented to person, place, and time. She exhibits normal muscle tone. Coordination normal.  Skin: Skin is warm.  Multiple small indurated papules to bilateral axilla.  3 cm fluctuance to right axilla with mild surrounding erythema.  No lymphangitis.      ED Course  Procedures (including critical care time) Labs Review Labs Reviewed - No data to display  Imaging Review No results found.   EKG Interpretation None      INCISION AND DRAINAGE Performed by: Maxwell CaulRIPLETT,Mindy Szatkowski L. Consent: Verbal consent obtained. Risks and benefits: risks, benefits and alternatives were discussed Type: abscess  Body area: right axilla Anesthesia: local infiltration  Incision was made with a #11 scalpel.  Local anesthetic: lidocaine 2% w/o epinephrine  Anesthetic total: 3 ml  Complexity: complex Blunt dissection to break up loculations  Drainage: purulent  Drainage amount: copious  Packing material: none  Patient tolerance: Patient tolerated the procedure well with no immediate complications.     MDM   Final diagnoses:  Abscess of axilla, right    Pt with several indurated papules to the bilateral axilla with scarring present.  Appears c/w hidradenitis suppurativa.  Pt is non-toxic appearing.  She was seen here on 11/21/13 for another complaint and prescribed #25 percocet, when asked if she taken her pain medication tonight she stated "I don't have anymore the doctor told me if I didn't need them to flush them down the commode".  Pt agrees to clindamycin prescribed.  She agrees to warm water soaks or compresses.  Appears stable for d/c   Mindy Brocious L. Trisha Mangleriplett, PA-C 11/27/13 0134

## 2013-12-18 ENCOUNTER — Encounter (HOSPITAL_COMMUNITY): Payer: Self-pay | Admitting: Emergency Medicine

## 2013-12-18 ENCOUNTER — Emergency Department (HOSPITAL_COMMUNITY)
Admission: EM | Admit: 2013-12-18 | Discharge: 2013-12-18 | Disposition: A | Discharge status: Home / Self Care | Payer: Medicare Other | Attending: Emergency Medicine | Admitting: Emergency Medicine

## 2013-12-18 DIAGNOSIS — Z880 Allergy status to penicillin: Secondary | ICD-10-CM | POA: Insufficient documentation

## 2013-12-18 DIAGNOSIS — R112 Nausea with vomiting, unspecified: Secondary | ICD-10-CM | POA: Insufficient documentation

## 2013-12-18 DIAGNOSIS — F17200 Nicotine dependence, unspecified, uncomplicated: Secondary | ICD-10-CM | POA: Insufficient documentation

## 2013-12-18 DIAGNOSIS — K8020 Calculus of gallbladder without cholecystitis without obstruction: Secondary | ICD-10-CM | POA: Insufficient documentation

## 2013-12-18 DIAGNOSIS — Z765 Malingerer [conscious simulation]: Secondary | ICD-10-CM | POA: Diagnosis not present

## 2013-12-18 DIAGNOSIS — K8050 Calculus of bile duct without cholangitis or cholecystitis without obstruction: Secondary | ICD-10-CM

## 2013-12-18 DIAGNOSIS — Z79899 Other long term (current) drug therapy: Secondary | ICD-10-CM | POA: Insufficient documentation

## 2013-12-18 DIAGNOSIS — J45909 Unspecified asthma, uncomplicated: Secondary | ICD-10-CM | POA: Insufficient documentation

## 2013-12-18 LAB — CBC WITH DIFFERENTIAL/PLATELET
BASOS PCT: 0 % (ref 0–1)
Basophils Absolute: 0 10*3/uL (ref 0.0–0.1)
Eosinophils Absolute: 0.2 10*3/uL (ref 0.0–0.7)
Eosinophils Relative: 2 % (ref 0–5)
HEMATOCRIT: 35 % — AB (ref 36.0–46.0)
HEMOGLOBIN: 11.4 g/dL — AB (ref 12.0–15.0)
LYMPHS ABS: 2.5 10*3/uL (ref 0.7–4.0)
Lymphocytes Relative: 30 % (ref 12–46)
MCH: 26.3 pg (ref 26.0–34.0)
MCHC: 32.6 g/dL (ref 30.0–36.0)
MCV: 80.8 fL (ref 78.0–100.0)
MONOS PCT: 9 % (ref 3–12)
Monocytes Absolute: 0.7 10*3/uL (ref 0.1–1.0)
NEUTROS ABS: 4.8 10*3/uL (ref 1.7–7.7)
Neutrophils Relative %: 59 % (ref 43–77)
Platelets: 479 10*3/uL — ABNORMAL HIGH (ref 150–400)
RBC: 4.33 MIL/uL (ref 3.87–5.11)
RDW: 17.3 % — ABNORMAL HIGH (ref 11.5–15.5)
WBC: 8.2 10*3/uL (ref 4.0–10.5)

## 2013-12-18 LAB — COMPREHENSIVE METABOLIC PANEL
ALBUMIN: 3.4 g/dL — AB (ref 3.5–5.2)
ALK PHOS: 70 U/L (ref 39–117)
ALT: 20 U/L (ref 0–35)
AST: 17 U/L (ref 0–37)
Anion gap: 12 (ref 5–15)
BUN: 7 mg/dL (ref 6–23)
CHLORIDE: 104 meq/L (ref 96–112)
CO2: 26 meq/L (ref 19–32)
Calcium: 8.8 mg/dL (ref 8.4–10.5)
Creatinine, Ser: 0.67 mg/dL (ref 0.50–1.10)
GFR calc Af Amer: >90 mL/min (ref >90)
GLUCOSE: 86 mg/dL (ref 70–99)
POTASSIUM: 3.8 meq/L (ref 3.7–5.3)
Sodium: 142 mEq/L (ref 137–147)
Total Bilirubin: <0.2 mg/dL — ABNORMAL LOW (ref 0.3–1.2)
Total Protein: 7.7 g/dL (ref 6.0–8.3)

## 2013-12-18 LAB — LIPASE, BLOOD: Lipase: 14 U/L (ref 11–59)

## 2013-12-18 MED ORDER — ONDANSETRON HCL 4 MG PO TABS: 4.0000 mg | ORAL_TABLET | Freq: Three times a day (TID) | ORAL | Status: DC | PRN

## 2013-12-18 MED ORDER — MORPHINE SULFATE 4 MG/ML IJ SOLN
4.0000 mg | Freq: Once | INTRAMUSCULAR | Status: AC
  Administered 2013-12-18: 4 mg via INTRAVENOUS
  Filled 2013-12-18: qty 1

## 2013-12-18 MED ORDER — TRAMADOL HCL 50 MG PO TABS
50.0000 mg | ORAL_TABLET | Freq: Four times a day (QID) | ORAL | Status: DC | PRN
Start: 2013-12-18 — End: 2014-03-25

## 2013-12-18 MED ORDER — ONDANSETRON HCL 4 MG/2ML IJ SOLN
4.0000 mg | Freq: Once | INTRAMUSCULAR | Status: AC
  Administered 2013-12-18: 4 mg via INTRAVENOUS
  Filled 2013-12-18: qty 2

## 2013-12-18 MED ORDER — SODIUM CHLORIDE 0.9 % IV BOLUS (SEPSIS)
1000.0000 mL | Freq: Once | INTRAVENOUS | Status: AC
  Administered 2013-12-18: 1000 mL via INTRAVENOUS

## 2013-12-18 NOTE — ED Notes (Signed)
Pt thirsty, given gingerale

## 2013-12-18 NOTE — ED Provider Notes (Addendum)
TIME SEEN: 8:00 PM  CHIEF COMPLAINT: Abdominal pain, nausea, vomiting, diarrhea  HPI: Patient is a 23 y.o. F with history of asthma and known cholelithiasis who presents to the emergency department with a gallbladder attack. She states that she had a cheese burger yesterday and started having pain in her right upper quadrant that she describes as sharp without radiation. She endorses nausea, vomiting and diarrhea. No fevers or chills. No bloody stool or melena. No dysuria or hematuria. No vaginal bleeding or discharge. She is not sexually active with men. She is only sexual active with women. Her partner is at bedside.  ROS: See HPI Constitutional: no fever  Eyes: no drainage  ENT: no runny nose   Cardiovascular:  no chest pain  Resp: no SOB  GI: vomiting GU: no dysuria Integumentary: no rash  Allergy: no hives  Musculoskeletal: no leg swelling  Neurological: no slurred speech ROS otherwise negative  PAST MEDICAL HISTORY/PAST SURGICAL HISTORY:  Past Medical History  Diagnosis Date  . Asthma   . Gall stones     MEDICATIONS:  Prior to Admission medications   Medication Sig Start Date End Date Taking? Authorizing Provider  albuterol (PROVENTIL HFA;VENTOLIN HFA) 108 (90 BASE) MCG/ACT inhaler Inhale 1-2 puffs into the lungs every 6 (six) hours as needed for wheezing or shortness of breath. 08/08/13  Yes Sunnie Nielsen, MD  montelukast (SINGULAIR) 10 MG tablet Take 10 mg by mouth daily as needed (for shortness of breath).  05/20/10  Yes Historical Provider, MD    ALLERGIES:  Allergies  Allergen Reactions  . Kiwi Extract Shortness Of Breath and Swelling    Throat and lip swelling  . Amoxicillin Hives  . Penicillins Hives    SOCIAL HISTORY:  History  Substance Use Topics  . Smoking status: Current Some Day Smoker -- 0.25 packs/day    Types: Cigarettes  . Smokeless tobacco: Not on file  . Alcohol Use: No    FAMILY HISTORY: History reviewed. No pertinent family  history.  EXAM: BP 117/72  Pulse 96  Temp(Src) 98.1 F (36.7 C) (Oral)  Resp 20  Ht 4\' 11"  (1.499 m)  Wt 297 lb 4.8 oz (134.854 kg)  BMI 60.02 kg/m2  SpO2 98%  LMP 12/13/2013 CONSTITUTIONAL: Alert and oriented and responds appropriately to questions. Well-appearing; well-nourished, nontoxic, in no distress HEAD: Normocephalic EYES: Conjunctivae clear, PERRL ENT: normal nose; no rhinorrhea; moist mucous membranes; pharynx without lesions noted NECK: Supple, no meningismus, no LAD  CARD: RRR; S1 and S2 appreciated; no murmurs, no clicks, no rubs, no gallops RESP: Normal chest excursion without splinting or tachypnea; breath sounds clear and equal bilaterally; no wheezes, no rhonchi, no rales,  ABD/GI: Normal bowel sounds; non-distended; soft, tender palpation in the right upper quadrant without guarding or rebound, negative Murphy sign BACK:  The back appears normal and is non-tender to palpation, there is no CVA tenderness EXT: Normal ROM in all joints; non-tender to palpation; no edema; normal capillary refill; no cyanosis    SKIN: Normal color for age and race; warm NEURO: Moves all extremities equally PSYCH: The patient's mood and manner are appropriate. Grooming and personal hygiene are appropriate.  MEDICAL DECISION MAKING: Patient here with biliary colic. Doubt cholecystitis. She is afebrile. Negative Murphy sign. I do not feel she needs repeat ultrasound. We'll obtain labs to evaluate for leukocytosis or changes in LFTs and lipase. We'll give pain and nausea medication. She states she has no plan with Dr. Lovell Sheehan in October.  ED PROGRESS: Patient's  labs are completely unremarkable. Her pain is controlled after one round of IV morphine and Zofran. She is able to tolerate by mouth. I feel she is safe to be discharged home. We'll also give [facility] [place] [facility] followup information. Have counseled patient on the importance of avoiding meals high in fat as this is contributing to  her recurrent pain. Have also discussed with her that given she is here for many times for the same symptom, the emergency department is not the most appropriate place to treat this now chronic condition. Discussed return precautions and supportive care instructions. She verbalized understanding and is comfortable with plan.     Layla MawKristen N Mabry Tift, DO 12/18/13 2217   Patient upset because I did not give her Dilaudid in the emergency department but gave her morphine for pain. She's also said that I am discharging her with tramadol. I feel that there is a drug seeking behavior present. Have discussed with her that this is now a chronic condition and she needs to be treated by a PCP or a Careers advisersurgeon.  Layla MawKristen N Abbygayle Helfand, DO 12/18/13 2231

## 2013-12-18 NOTE — ED Notes (Signed)
Pt states she was diagnosed a couple of months ago with gallstones. Presents with N/V/D x 1 day. States she vomited blood this morning and is having abdominal pain.

## 2013-12-18 NOTE — ED Notes (Signed)
Pt states she also needs STD check.

## 2013-12-18 NOTE — Discharge Instructions (Signed)
Biliary Colic  °Biliary colic is a steady or irregular pain in the upper abdomen. It is usually under the right side of the rib cage. It happens when gallstones interfere with the normal flow of bile from the gallbladder. Bile is a liquid that helps to digest fats. Bile is made in the liver and stored in the gallbladder. When you eat a meal, bile passes from the gallbladder through the cystic duct and the common bile duct into the small intestine. There, it mixes with partially digested food. If a gallstone blocks either of these ducts, the normal flow of bile is blocked. The muscle cells in the bile duct contract forcefully to try to move the stone. This causes the pain of biliary colic.  °SYMPTOMS  °· A person with biliary colic usually complains of pain in the upper abdomen. This pain can be: °¨ In the center of the upper abdomen just below the breastbone. °¨ In the upper-right part of the abdomen, near the gallbladder and liver. °¨ Spread back toward the right shoulder blade. °· Nausea and vomiting. °· The pain usually occurs after eating. °· Biliary colic is usually triggered by the digestive system's demand for bile. The demand for bile is high after fatty meals. Symptoms can also occur when a person who has been fasting suddenly eats a very large meal. Most episodes of biliary colic pass after 1 to 5 hours. After the most intense pain passes, your abdomen may continue to ache mildly for about 24 hours. °DIAGNOSIS  °After you describe your symptoms, your caregiver will perform a physical exam. He or she will pay attention to the upper right portion of your belly (abdomen). This is the area of your liver and gallbladder. An ultrasound will help your caregiver look for gallstones. Specialized scans of the gallbladder may also be done. Blood tests may be done, especially if you have fever or if your pain persists. °PREVENTION  °Biliary colic can be prevented by controlling the risk factors for gallstones. Some of  these risk factors, such as heredity, increasing age, and pregnancy are a normal part of life. Obesity and a high-fat diet are risk factors you can change through a healthy lifestyle. Women going through menopause who take hormone replacement therapy (estrogen) are also more likely to develop biliary colic. °TREATMENT  °· Pain medication may be prescribed. °· You may be encouraged to eat a fat-free diet. °· If the first episode of biliary colic is severe, or episodes of colic keep retuning, surgery to remove the gallbladder (cholecystectomy) is usually recommended. This procedure can be done through small incisions using an instrument called a laparoscope. The procedure often requires a brief stay in the hospital. Some people can leave the hospital the same day. It is the most widely used treatment in people troubled by painful gallstones. It is effective and safe, with no complications in more than 90% of cases. °· If surgery cannot be done, medication that dissolves gallstones may be used. This medication is expensive and can take months or years to work. Only small stones will dissolve. °· Rarely, medication to dissolve gallstones is combined with a procedure called shock-wave lithotripsy. This procedure uses carefully aimed shock waves to break up gallstones. In many people treated with this procedure, gallstones form again within a few years. °PROGNOSIS  °If gallstones block your cystic duct or common bile duct, you are at risk for repeated episodes of biliary colic. There is also a 25% chance that you will develop   a gallbladder infection(acute cholecystitis), or some other complication of gallstones within 10 to 20 years. If you have surgery, schedule it at a time that is convenient for you and at a time when you are not sick. °HOME CARE INSTRUCTIONS  °· Drink plenty of clear fluids. °· Avoid fatty, greasy or fried foods, or any foods that make your pain worse. °· Take medications as directed. °SEEK MEDICAL  CARE IF:  °· You develop a fever over 100.5° F (38.1° C). °· Your pain gets worse over time. °· You develop nausea that prevents you from eating and drinking. °· You develop vomiting. °SEEK IMMEDIATE MEDICAL CARE IF:  °· You have continuous or severe belly (abdominal) pain which is not relieved with medications. °· You develop nausea and vomiting which is not relieved with medications. °· You have symptoms of biliary colic and you suddenly develop a fever and shaking chills. This may signal cholecystitis. Call your caregiver immediately. °· You develop a yellow color to your skin or the white part of your eyes (jaundice). °Document Released: 09/20/2005 Document Revised: 07/12/2011 Document Reviewed: 11/30/2007 °ExitCare® Patient Information ©2015 [employer]. This information is not intended to replace advice given to you by your health care provider. Make sure you discuss any questions you have with your health care provider. ° °Cholelithiasis °Cholelithiasis (also called gallstones) is a form of gallbladder disease in which gallstones form in your gallbladder. The gallbladder is an organ that stores bile made in the liver, which helps digest fats. Gallstones begin as small crystals and slowly grow into stones. Gallstone pain occurs when the gallbladder spasms and a gallstone is blocking the duct. Pain can also occur when a stone passes out of the duct.  °RISK FACTORS °· Being female.   °· Having multiple pregnancies. Health care providers sometimes advise removing diseased gallbladders before future pregnancies.   °· Being obese. °· Eating a diet heavy in fried foods and fat.   °· Being older than 60 years and increasing age.   °· Prolonged use of medicines containing female hormones.   °· Having diabetes mellitus.   °· Rapidly losing weight.   °· Having a family history of gallstones (heredity).   °SYMPTOMS °· Nausea.   °· Vomiting. °· Abdominal pain.   °· Yellowing of the skin (jaundice).   °· Sudden pain. It  may persist from several minutes to several hours. °· Fever.   °· Tenderness to the touch.  °In some cases, when gallstones do not move into the bile duct, people have no pain or symptoms. These are called "silent" gallstones.  °TREATMENT °Silent gallstones do not need treatment. In severe cases, emergency surgery may be required. Options for treatment include: °· Surgery to remove the gallbladder. This is the most common treatment. °· Medicines. These do not always work and may take 6-12 months or more to work. °· Shock wave treatment (extracorporeal biliary lithotripsy). In this treatment an ultrasound machine sends shock waves to the gallbladder to break gallstones into smaller pieces that can pass into the intestines or be dissolved by medicine. °HOME CARE INSTRUCTIONS  °· Only take over-the-counter or prescription medicines for pain, discomfort, or fever as directed by your health care provider.   °· Follow a low-fat diet until seen again by your health care provider. Fat causes the gallbladder to contract, which can result in pain.   °· Follow up with your health care provider as directed. Attacks are almost always recurrent and surgery is usually required for permanent treatment.   °SEEK IMMEDIATE MEDICAL CARE IF:  °· Your pain increases and is   not controlled by medicines.   °· You have a fever or persistent symptoms for more than 2-3 days.   °· You have a fever and your symptoms suddenly get worse.   °· You have persistent nausea and vomiting.   °MAKE SURE YOU:  °· Understand these instructions. °· Will watch your condition. °· Will get help right away if you are not doing well or get worse. °Document Released: 04/15/2005 Document Revised: 12/20/2012 Document Reviewed: 10/11/2012 °ExitCare® Patient Information ©2015 [employer]. This information is not intended to replace advice given to you by your health care provider. Make sure you discuss any questions you have with your health care  provider. ° °Low-Fat Diet for Pancreatitis or Gallbladder Conditions °A low-fat diet can be helpful if you have pancreatitis or a gallbladder condition. With these conditions, your pancreas and gallbladder have trouble digesting fats. A healthy eating plan with less fat will help rest your pancreas and gallbladder and reduce your symptoms. °WHAT DO I NEED TO KNOW ABOUT THIS DIET? °· Eat a low-fat diet. °¨ Reduce your fat intake to less than 20-30% of your total daily calories. This is less than 50-60 g of fat per day. °¨ Remember that you need some fat in your diet. Ask your dietician what your daily goal should be. °¨ Choose nonfat and low-fat healthy foods. Look for the words "nonfat," "low fat," or "fat free." °¨ As a guide, look on the label and choose foods with less than 3 g of fat per serving. Eat only one serving. °· Avoid alcohol. °· Do not smoke. If you need help quitting, talk with your health care provider. °· Eat small frequent meals instead of three large heavy meals. °WHAT FOODS CAN I EAT? °Grains °Include healthy grains and starches such as potatoes, wheat bread, fiber-rich cereal, and brown rice. Choose whole grain options whenever possible. In adults, whole grains should account for 45-65% of your daily calories.  °Fruits and Vegetables °Eat plenty of fruits and vegetables. Fresh fruits and vegetables add fiber to your diet. °Meats and Other Protein Sources °Eat lean meat such as chicken and pork. Trim any fat off of meat before cooking it. Eggs, fish, and beans are other sources of protein. In adults, these foods should account for 10-35% of your daily calories. °Dairy °Choose low-fat milk and dairy options. Dairy includes fat and protein, as well as calcium.  °Fats and Oils °Limit high-fat foods such as fried foods, sweets, baked goods, sugary drinks.  °Other °Creamy sauces and condiments, such as mayonnaise, can add extra fat. Think about whether or not you need to use them, or use smaller  amounts or low fat options. °WHAT FOODS ARE NOT RECOMMENDED? °· High fat foods, such as: °¨ Baked goods. °¨ Ice cream. °¨ French toast. °¨ Sweet rolls. °¨ Pizza. °¨ Cheese bread. °¨ Foods covered with batter, butter, creamy sauces, or cheese. °¨ Fried foods. °¨ Sugary drinks and desserts. °· Foods that cause gas or bloating °Document Released: 04/24/2013 Document Reviewed: 04/24/2013 °ExitCare® Patient Information ©2015 [employer]. This information is not intended to replace advice given to you by your health care provider. Make sure you discuss any questions you have with your health care provider. ° °

## 2013-12-19 ENCOUNTER — Encounter (HOSPITAL_COMMUNITY): Payer: Self-pay | Admitting: Emergency Medicine

## 2013-12-19 ENCOUNTER — Emergency Department (HOSPITAL_COMMUNITY)
Admission: EM | Admit: 2013-12-19 | Discharge: 2013-12-19 | Disposition: A | Discharge status: Home / Self Care | Payer: Medicare Other | Attending: Emergency Medicine | Admitting: Emergency Medicine

## 2013-12-19 DIAGNOSIS — Z3202 Encounter for pregnancy test, result negative: Secondary | ICD-10-CM | POA: Insufficient documentation

## 2013-12-19 DIAGNOSIS — K8050 Calculus of bile duct without cholangitis or cholecystitis without obstruction: Secondary | ICD-10-CM

## 2013-12-19 DIAGNOSIS — K8020 Calculus of gallbladder without cholecystitis without obstruction: Secondary | ICD-10-CM | POA: Insufficient documentation

## 2013-12-19 DIAGNOSIS — Z79899 Other long term (current) drug therapy: Secondary | ICD-10-CM | POA: Diagnosis not present

## 2013-12-19 DIAGNOSIS — Z880 Allergy status to penicillin: Secondary | ICD-10-CM | POA: Insufficient documentation

## 2013-12-19 DIAGNOSIS — J45909 Unspecified asthma, uncomplicated: Secondary | ICD-10-CM | POA: Diagnosis not present

## 2013-12-19 DIAGNOSIS — R109 Unspecified abdominal pain: Secondary | ICD-10-CM | POA: Insufficient documentation

## 2013-12-19 DIAGNOSIS — F17200 Nicotine dependence, unspecified, uncomplicated: Secondary | ICD-10-CM | POA: Diagnosis not present

## 2013-12-19 LAB — COMPREHENSIVE METABOLIC PANEL
ALBUMIN: 3.2 g/dL — AB (ref 3.5–5.2)
ALK PHOS: 61 U/L (ref 39–117)
ALT: 20 U/L (ref 0–35)
ANION GAP: 12 (ref 5–15)
AST: 18 U/L (ref 0–37)
BUN: 6 mg/dL (ref 6–23)
CHLORIDE: 102 meq/L (ref 96–112)
CO2: 25 mEq/L (ref 19–32)
Calcium: 8.7 mg/dL (ref 8.4–10.5)
Creatinine, Ser: 0.68 mg/dL (ref 0.50–1.10)
GFR calc Af Amer: >90 mL/min (ref >90)
GFR calc non Af Amer: >90 mL/min (ref >90)
Glucose, Bld: 77 mg/dL (ref 70–99)
Potassium: 4.1 mEq/L (ref 3.7–5.3)
Sodium: 139 mEq/L (ref 137–147)
Total Bilirubin: 0.2 mg/dL — ABNORMAL LOW (ref 0.3–1.2)
Total Protein: 7.7 g/dL (ref 6.0–8.3)

## 2013-12-19 LAB — URINALYSIS, ROUTINE W REFLEX MICROSCOPIC
BILIRUBIN URINE: NEGATIVE
GLUCOSE, UA: NEGATIVE mg/dL
Hgb urine dipstick: NEGATIVE
KETONES UR: NEGATIVE mg/dL
Leukocytes, UA: NEGATIVE
Nitrite: NEGATIVE
PROTEIN: NEGATIVE mg/dL
Specific Gravity, Urine: 1.015 (ref 1.005–1.030)
UROBILINOGEN UA: 0.2 mg/dL (ref 0.0–1.0)
pH: 6.5 (ref 5.0–8.0)

## 2013-12-19 LAB — CBC WITH DIFFERENTIAL/PLATELET
BASOS PCT: 0 % (ref 0–1)
Basophils Absolute: 0 10*3/uL (ref 0.0–0.1)
Eosinophils Absolute: 0.2 10*3/uL (ref 0.0–0.7)
Eosinophils Relative: 2 % (ref 0–5)
HCT: 35.6 % — ABNORMAL LOW (ref 36.0–46.0)
Hemoglobin: 11.5 g/dL — ABNORMAL LOW (ref 12.0–15.0)
Lymphocytes Relative: 35 % (ref 12–46)
Lymphs Abs: 2.5 10*3/uL (ref 0.7–4.0)
MCH: 26.4 pg (ref 26.0–34.0)
MCHC: 32.3 g/dL (ref 30.0–36.0)
MCV: 81.8 fL (ref 78.0–100.0)
MONO ABS: 0.6 10*3/uL (ref 0.1–1.0)
Monocytes Relative: 9 % (ref 3–12)
Neutro Abs: 3.8 10*3/uL (ref 1.7–7.7)
Neutrophils Relative %: 54 % (ref 43–77)
Platelets: 450 10*3/uL — ABNORMAL HIGH (ref 150–400)
RBC: 4.35 MIL/uL (ref 3.87–5.11)
RDW: 17.5 % — ABNORMAL HIGH (ref 11.5–15.5)
WBC: 7 10*3/uL (ref 4.0–10.5)

## 2013-12-19 LAB — LIPASE, BLOOD: Lipase: 12 U/L (ref 11–59)

## 2013-12-19 MED ORDER — ONDANSETRON HCL 4 MG/2ML IJ SOLN
4.0000 mg | Freq: Once | INTRAMUSCULAR | Status: AC
  Administered 2013-12-19: 4 mg via INTRAVENOUS
  Filled 2013-12-19: qty 2

## 2013-12-19 MED ORDER — HYDROCODONE-ACETAMINOPHEN 5-325 MG PO TABS: 1.0000 | ORAL_TABLET | Freq: Four times a day (QID) | ORAL | Status: DC | PRN

## 2013-12-19 MED ORDER — HYDROMORPHONE HCL PF 1 MG/ML IJ SOLN
1.0000 mg | Freq: Once | INTRAMUSCULAR | Status: AC
  Administered 2013-12-19: 1 mg via INTRAVENOUS
  Filled 2013-12-19: qty 1

## 2013-12-19 NOTE — ED Notes (Signed)
Patient states pain to right upper abd. Patient states she has gallstones and was seen yesterday without relief of pain. Patient is alert and oriented X4.

## 2013-12-19 NOTE — Discharge Instructions (Signed)
Biliary Colic  Take Tylenol for mild pain or the pain medicine prescribed for bad pain. Go to the outpatient x-ray department tomorrow to get an ultrasound of your gallbladder. Please arrive at 9:45 AM. Call any of the numbers on the resource guide to get a primary care physician Biliary colic is a steady or irregular pain in the upper abdomen. It is usually under the right side of the rib cage. It happens when gallstones interfere with the normal flow of bile from the gallbladder. Bile is a liquid that helps to digest fats. Bile is made in the liver and stored in the gallbladder. When you eat a meal, bile passes from the gallbladder through the cystic duct and the common bile duct into the small intestine. There, it mixes with partially digested food. If a gallstone blocks either of these ducts, the normal flow of bile is blocked. The muscle cells in the bile duct contract forcefully to try to move the stone. This causes the pain of biliary colic.  SYMPTOMS   A person with biliary colic usually complains of pain in the upper abdomen. This pain can be:  In the center of the upper abdomen just below the breastbone.  In the upper-right part of the abdomen, near the gallbladder and liver.  Spread back toward the right shoulder blade.  Nausea and vomiting.  The pain usually occurs after eating.  Biliary colic is usually triggered by the digestive system's demand for bile. The demand for bile is high after fatty meals. Symptoms can also occur when a person who has been fasting suddenly eats a very large meal. Most episodes of biliary colic pass after 1 to 5 hours. After the most intense pain passes, your abdomen may continue to ache mildly for about 24 hours. DIAGNOSIS  After you describe your symptoms, your caregiver will perform a physical exam. He or she will pay attention to the upper right portion of your belly (abdomen). This is the area of your liver and gallbladder. An ultrasound will help your  caregiver look for gallstones. Specialized scans of the gallbladder may also be done. Blood tests may be done, especially if you have fever or if your pain persists. PREVENTION  Biliary colic can be prevented by controlling the risk factors for gallstones. Some of these risk factors, such as heredity, increasing age, and pregnancy are a normal part of life. Obesity and a high-fat diet are risk factors you can change through a healthy lifestyle. Women going through menopause who take hormone replacement therapy (estrogen) are also more likely to develop biliary colic. TREATMENT   Pain medication may be prescribed.  You may be encouraged to eat a fat-free diet.  If the first episode of biliary colic is severe, or episodes of colic keep retuning, surgery to remove the gallbladder (cholecystectomy) is usually recommended. This procedure can be done through small incisions using an instrument called a laparoscope. The procedure often requires a brief stay in the hospital. Some people can leave the hospital the same day. It is the most widely used treatment in people troubled by painful gallstones. It is effective and safe, with no complications in more than 90% of cases.  If surgery cannot be done, medication that dissolves gallstones may be used. This medication is expensive and can take months or years to work. Only small stones will dissolve.  Rarely, medication to dissolve gallstones is combined with a procedure called shock-wave lithotripsy. This procedure uses carefully aimed shock waves to break up  gallstones. In many people treated with this procedure, gallstones form again within a few years. PROGNOSIS  If gallstones block your cystic duct or common bile duct, you are at risk for repeated episodes of biliary colic. There is also a 25% chance that you will develop a gallbladder infection(acute cholecystitis), or some other complication of gallstones within 10 to 20 years. If you have surgery,  schedule it at a time that is convenient for you and at a time when you are not sick. HOME CARE INSTRUCTIONS   Drink plenty of clear fluids.  Avoid fatty, greasy or fried foods, or any foods that make your pain worse.  Take medications as directed. SEEK MEDICAL CARE IF:   You develop a fever over 100.5 F (38.1 C).  Your pain gets worse over time.  You develop nausea that prevents you from eating and drinking.  You develop vomiting. SEEK IMMEDIATE MEDICAL CARE IF:   You have continuous or severe belly (abdominal) pain which is not relieved with medications.  You develop nausea and vomiting which is not relieved with medications.  You have symptoms of biliary colic and you suddenly develop a fever and shaking chills. This may signal cholecystitis. Call your caregiver immediately.  You develop a yellow color to your skin or the white part of your eyes (jaundice). Document Released: 09/20/2005 Document Revised: 07/12/2011 Document Reviewed: 11/30/2007 [facility] Patient Information 2015 [place], [place]. This information is not intended to replace advice given to you by your health care provider. Make sure you discuss any questions you have with your health care provider.  Emergency Department Resource Guide 1) Find a Doctor and Pay Out of Pocket Although you won't have to find out who is covered by your insurance plan, it is a good idea to ask around and get recommendations. You will then need to call the office and see if the doctor you have chosen will accept you as a new patient and what types of options they offer for patients who are self-pay. Some doctors offer discounts or will set up payment plans for their patients who do not have insurance, but you will need to ask so you aren't surprised when you get to your appointment.  2) Contact Your Local Health Department Not all health departments have doctors that can see patients for sick visits, but many do, so it is worth a call to  see if yours does. If you don't know where your local health department is, you can check in your phone book. The CDC also has a tool to help you locate your state's health department, and many state websites also have listings of all of their local health departments.  3) Find a Walk-in Clinic If your illness is not likely to be very severe or complicated, you may want to try a walk in clinic. These are popping up all over the country in pharmacies, drugstores, and shopping centers. They're usually staffed by nurse practitioners or physician assistants that have been trained to treat common illnesses and complaints. They're usually fairly quick and inexpensive. However, if you have serious medical issues or chronic medical problems, these are probably not your best option.  No Primary Care Doctor: - Call Health Connect at  [phone] - they can help you locate a primary care doctor that  accepts your insurance, provides certain services, etc. - Physician Referral Service- [phone]  Chronic Pain Problems: Organization         Address  Phone   Notes  [facility]  [phone] Patients need to be referred by their primary care doctor.   Medication Assistance: Organization         Address  Phone   Notes  [facility] Medication [facility] [address] [place], [place] [address] [phone] --Must be a [facility] of [facility] -- Must have NO insurance coverage whatsoever (no Medicaid/ Medicare, etc.) -- The pt. MUST have a primary care doctor that directs their care regularly and follows them in the community   [facility]ist  [phone]   [employer]  [phone]    Agencies that provide inexpensive medical care: Organization         Address  Phone   Notes  [facility]  [phone]   [facility]    [phone]   [facility] [address] [place], [place] [address] [phone]   [facility] [address] [place] [place] [phone]   Planned Parenthood    [phone]   [facility]    [phone]   [facility]  [address], [place] Phone:  [phone], Fax:  [phone] Hours of Operation:  9 am - 6 pm, M-F.  Also accepts Medicaid/Medicare and self-pay.  [facility]  [address], [place] Phone: [phone], Fax: [phone]. Hours of Operation:  8:30 am - 5:30 pm, M-F.  Also accepts Medicaid and self-pay.  [facility] [place] [address], [place] Phone: [phone]   [facility] [address] Natasha Bence [place], [place] [phone], Ext. [phone] Mondays & Thursdays: 7-9 AM.  First 15 patients are seen on a first come, first serve basis.    Medicaid-accepting [facility] Providers:  Organization         Address  Phone   Notes  [facility] [address], [place] [phone] Also accepts self-pay patients.  [facility] [address] Laurell Josephs [place], [place]  [phone]   [facility] [address], [place] [phone]   [facility] [address], [place] [phone]   Renaye Rakers [address], [place]   [phone] Only accepts [place] patients after they have their name applied to their card.   Self-Pay (no insurance) in [facility]:  Organization         Address  Phone   Notes  Sickle Cell Patients, [facility] [address] [place], [place] [phone]   [facility] [address] [place], [place] [phone]   [facility] [place]  [address], South  [phone]   [facility]/Dr. Osei-Bonsu  [address], [place] or [address], [place] [phone] Phone number for both [place] and [place] locations is the same.  [facility] [address] [place] [phone]   [facility] [address] [place] or [address] [phone]   [facility] [address], [place] [phone], phone; [phone], fax Sees patients 1st and 3rd Saturday of every month.  Must not qualify for public or private insurance (i.e. Medicaid, Medicare,  [place] Health Choice, Veterans' Benefits)  Household income should be no more than 200% of the poverty level The clinic cannot treat you if you are pregnant or think you are pregnant  Sexually transmitted diseases are not treated at the clinic.    Dental Care: Organization         Address  Phone  Notes  [facility] Department of [facility] [address] [place], [place] [phone] Accepts children up to age 39 who are enrolled in [place] or [place] Health Choice; pregnant women with a Medicaid card; and children who have applied for Medicaid or [place] Health Choice, but were declined, whose parents can pay a reduced fee at time of service.  [facility] Department of [facility]  [address], [place] [phone] Accepts children up to age 32 who are enrolled in [place] or [place] Health Choice; pregnant women with a Medicaid card; and children who have applied for Medicaid or [place] Health Choice, but were declined, whose parents can pay a reduced fee at time of service.  [facility]  [address] [place], [place] [phone] Patients are seen by appointment only. Walk-ins are not accepted. [facility] will see patients 39 years of age and older. Monday - Tuesday (8am-5pm) Most Wednesdays (8:30-5pm) $30 per visit, cash only  [facility]  [address], [facility] [phone] Patients are  seen by appointment only. Walk-ins are not accepted. [facility] will see patients 20 years of age and older. One Wednesday Evening (Monthly: Volunteer Based).  $30 per visit, cash only  [employer]  [phone] for adults; Children under age 27, call Graduate Pediatric Dentistry at [phone]. Children aged 94-14, please call [phone] to request a pediatric application.  Dental services are provided in all areas of dental care including fillings, crowns and bridges, complete and partial dentures, implants, gum treatment, root canals, and extractions. Preventive care is also provided. Treatment is provided to both adults and children. Patients are selected via a lottery and there is often a waiting list.   [facility] [address], [place]  [phone] [email or web address]   [facility] [address] [place], [place] [phone], Ext. [phone] Second and Fourth Thursday of each month, opens at 6:30 AM; Clinic ends at 9 AM.  Patients are seen on a first-come first-served basis, and a limited number are seen during each clinic.   [facility]  [address] Ether Griffins [place], [place] [phone]   Eligibility Requirements You must have lived in [place], [place], or [place] counties for at least the last three months.   You cannot be eligible for state or federal sponsored [place], including [employer], [place], or [employer].   You generally cannot be eligible for healthcare insurance through your employer.    How to apply: Eligibility screenings are held every Tuesday and Wednesday afternoon from 1:00 pm until 4:00 pm. You do not need an appointment for the interview!  [facility] [address] [place], [place] [phone]   [facility] Health Department  [phone]   [facility] Health Department  [phone]   [facility] Health Department  [phone]     Behavioral Health Resources in the Community: Intensive Outpatient Programs Organization         Address  Phone  Notes  [facility]  [address], [place], [place] [phone]   [facility] [address], [place], [place] [phone]   [facility] [address], [place], [place]  [phone]   [facility] [address],  [place], [place] [phone] or [phone]   Substance Abuse Resources Organization         Address  Phone  Notes  [facility]  [phone]   [facility]  [phone]   [facility]  [phone]   [facility]  [phone]   Residential & Outpatient Substance Abuse Program  [phone]   Psychological Services Organization         Address  Phone  Notes  [facility]  [phone]- [phone]   [facility]  [phone]- [phone]   [facility] [address], [place] [phone] or [phone]    Mobile Crisis Teams Organization         Address  Phone  Notes  [facility], Mobile Crisis Care Unit  [phone]   [facility]  [address] [place], [place] [phone]   Doristine LocksSharon DeEsch [address] [place] [phone]    Self-Help/Support Groups Organization         Address  Phone             Notes  [facility] [place] - variety of support groups  336- [phone] Call for more information  Narcotics Anonymous (NA), [facility] [address], [place]  2 meetings at this location   Statisticianesidential Treatment Programs Organization         Address  Phone  Notes  [facility] [address],    [place]  [phone]   [facility]  [address], [place] [phone], [place], [place] [phone]   [facility] [address], [place] [phone] Admissions: 8am-3pm M-F  Incentives  [facility] Treatment Center [address]    [place], [place] [phone]   [facility] [address], [place], [place] [phone]   [facility] [address]  [place], [place] [phone]   [facility] - Intensive Outpatient [address], [place], [place] [phone]   [facility] ([facility] [address]  [place], [place] [phone] or [phone]   [facility] ([facility]) [address] [place], [place] [phone] Accepts Medicaid  [facility] [address]  [place] [phone] Substance Abuse/Addiction Treatment   [facility] Resources Organization         Address  Phone  Notes  [facility]  [phone]   Angie FavaJulie Brannon, PhD [address], [address] [place], [place]   [phone] or [phone]   [facility]   [address] [place], [place] [phone]   [facility] [address], [place], [place] [phone] Insurance/Medicaid/sponsorship through [facility]  [facility] [address]                                    [place], [place] [phone] Therapy/tele-psych/case  [facility] [address]   [place], [place] [phone]    Dr. Lolly MustacheArfeen  [phone]   [facility] [place] [facility]  [employer] [facility] 1) [address], [place] 2) [address] [place] 3)  [address], [place] [phone]  [phone]   [facility] Child Abuse Hotline [phone] or [phone] (After Hours)

## 2013-12-19 NOTE — ED Notes (Signed)
Pt states pain due to gall stones, seen here last night for same. Pt states she was given Tramadol and it is not working.

## 2013-12-19 NOTE — ED Provider Notes (Signed)
CSN: [id number]     Arrival date & time 12/19/13  1706 History   First MD Initiated Contact with Patient 12/19/13 1835     Chief Complaint  Patient presents with  . Abdominal Pain     (Consider location/radiation/quality/duration/timing/severity/associated sxs/prior Treatment) HPI Complains of upper abdominal pain onset approximately 3 days ago. She states she gets similar pain approximately every week. It's felt secondary to gallstones. Pain is worse with eating. She last ate 9 AM today. She denies fever. She vomited one time today. She was seen here last night for same complaint treated with intravenous opioids and antiemetics. Sent home with prescription for Zofran and tramadol. She states she's taken the tramadol, without adequate pain relief. No fever. Pain is worse with eating. Not improved by anything. No other associated symptoms. Last normal menstrual period 3 weeks ago. Past Medical History  Diagnosis Date  . Asthma   . Gall stones    History reviewed. No pertinent past surgical history. No family history on file. History  Substance Use Topics  . Smoking status: Current Some Day Smoker -- 0.25 packs/day    Types: Cigarettes  . Smokeless tobacco: Not on file  . Alcohol Use: No   OB History   Grav Para Term Preterm Abortions TAB SAB Ect Mult Living                 Review of Systems  Constitutional: Negative.   HENT: Negative.   Respiratory: Negative.   Cardiovascular: Negative.   Gastrointestinal: Positive for nausea, vomiting and abdominal pain.  Musculoskeletal: Negative.   Skin: Negative.   Neurological: Negative.   Psychiatric/Behavioral: Negative.   All other systems reviewed and are negative.     Allergies  Kiwi extract; Amoxicillin; and Penicillins  Home Medications   Prior to Admission medications   Medication Sig Start Date End Date Taking? Authorizing Provider  albuterol (PROVENTIL HFA;VENTOLIN HFA) 108 (90 BASE) MCG/ACT inhaler Inhale 1-2 puffs  into the lungs every 6 (six) hours as needed for wheezing or shortness of breath. 08/08/13  Yes Sunnie Nielsen, MD  montelukast (SINGULAIR) 10 MG tablet Take 10 mg by mouth daily as needed (for shortness of breath).  05/20/10  Yes Historical Provider, MD  ondansetron (ZOFRAN) 4 MG tablet Take 1 tablet (4 mg total) by mouth every 8 (eight) hours as needed for nausea or vomiting. 12/18/13  Yes Kristen N Ward, DO  traMADol (ULTRAM) 50 MG tablet Take 1 tablet (50 mg total) by mouth every 6 (six) hours as needed. 12/18/13  Yes Kristen N Ward, DO   BP 127/91  Pulse 95  Temp(Src) 98.5 F (36.9 C) (Oral)  Ht 4\' 11"  (1.499 m)  Wt 297 lb (134.718 kg)  BMI 59.95 kg/m2  SpO2 99%  LMP 12/13/2013 Physical Exam  Nursing note and vitals reviewed. Constitutional: She appears well-developed and well-nourished. No distress.  HENT:  Head: Normocephalic and atraumatic.  Eyes: Conjunctivae are normal. Pupils are equal, round, and reactive to light.  Neck: Neck supple. No tracheal deviation present. No thyromegaly present.  Cardiovascular: Normal rate and regular rhythm.   No murmur heard. Pulmonary/Chest: Effort normal and breath sounds normal.  Abdominal: Soft. Bowel sounds are normal. She exhibits no distension. There is tenderness.  Morbidly obese. Tender diffusely, mostly at right upper quadrant. Equivocal Murphy's sign  Musculoskeletal: Normal range of motion. She exhibits no edema and no tenderness.  Neurological: She is alert. Coordination normal.  Skin: Skin is warm and dry. No rash noted.  Psychiatric: She has a normal mood and affect.    ED Course  Procedures (including critical care time) Labs Review Labs Reviewed - No data to display  Imaging Review No results found.   EKG Interpretation None     9:05 PM pain and nausea improved after treatment with intravenous opioids and antiemetics. Results for orders placed during the hospital encounter of 12/19/13  URINALYSIS, ROUTINE W REFLEX  MICROSCOPIC      Result Value Ref Range   Color, Urine YELLOW  YELLOW   APPearance CLEAR  CLEAR   Specific Gravity, Urine 1.015  1.005 - 1.030   pH 6.5  5.0 - 8.0   Glucose, UA NEGATIVE  NEGATIVE mg/dL   Hgb urine dipstick NEGATIVE  NEGATIVE   Bilirubin Urine NEGATIVE  NEGATIVE   Ketones, ur NEGATIVE  NEGATIVE mg/dL   Protein, ur NEGATIVE  NEGATIVE mg/dL   Urobilinogen, UA 0.2  0.0 - 1.0 mg/dL   Nitrite NEGATIVE  NEGATIVE   Leukocytes, UA NEGATIVE  NEGATIVE  COMPREHENSIVE METABOLIC PANEL      Result Value Ref Range   Sodium 139  137 - 147 mEq/L   Potassium 4.1  3.7 - 5.3 mEq/L   Chloride 102  96 - 112 mEq/L   CO2 25  19 - 32 mEq/L   Glucose, Bld 77  70 - 99 mg/dL   BUN 6  6 - 23 mg/dL   Creatinine, Ser 1.470.68  0.50 - 1.10 mg/dL   Calcium 8.7  8.4 - 82.910.5 mg/dL   Total Protein 7.7  6.0 - 8.3 g/dL   Albumin 3.2 (*) 3.5 - 5.2 g/dL   AST 18  0 - 37 U/L   ALT 20  0 - 35 U/L   Alkaline Phosphatase 61  39 - 117 U/L   Total Bilirubin 0.2 (*) 0.3 - 1.2 mg/dL   GFR calc non Af Amer >90  >90 mL/min   GFR calc Af Amer >90  >90 mL/min   Anion gap 12  5 - 15  CBC WITH DIFFERENTIAL      Result Value Ref Range   WBC 7.0  4.0 - 10.5 K/uL   RBC 4.35  3.87 - 5.11 MIL/uL   Hemoglobin 11.5 (*) 12.0 - 15.0 g/dL   HCT 56.235.6 (*) 13.036.0 - 86.546.0 %   MCV 81.8  78.0 - 100.0 fL   MCH 26.4  26.0 - 34.0 pg   MCHC 32.3  30.0 - 36.0 g/dL   RDW 78.417.5 (*) 69.611.5 - 29.515.5 %   Platelets 450 (*) 150 - 400 K/uL   Neutrophils Relative % 54  43 - 77 %   Neutro Abs 3.8  1.7 - 7.7 K/uL   Lymphocytes Relative 35  12 - 46 %   Lymphs Abs 2.5  0.7 - 4.0 K/uL   Monocytes Relative 9  3 - 12 %   Monocytes Absolute 0.6  0.1 - 1.0 K/uL   Eosinophils Relative 2  0 - 5 %   Eosinophils Absolute 0.2  0.0 - 0.7 K/uL   Basophils Relative 0  0 - 1 %   Basophils Absolute 0.0  0.0 - 0.1 K/uL  LIPASE, BLOOD      Result Value Ref Range   Lipase 12  11 - 59 U/L   No results found. Reported to me urine POC preg test Negative  By  nursing, test did not cross over in computer  MDM  Clinically no evidence of acute cholecystitis. Ultrasound not  available presently . planPrescription Norco. Abdominal ultrasound scheduled for patient tomorrow 9:45 AM She is referred to the resource guide to get primary care physician. She was written prescription for Zofran yesterday  Diagnosis biliary colic Final diagnoses:  None        Doug Sou, MD 12/19/13 2116

## 2013-12-19 NOTE — ED Notes (Signed)
Patient is cursing because she can not have anything to drink until DEP see her. EDP is in room states that she is able to have water. Patient on phone while EDP trying to give her instruction for tomorrow.

## 2013-12-19 NOTE — ED Notes (Signed)
Patient verbalizes understanding of discharge instructions, pain management, follow up with PCP, and out patient ultrasound. Patient ambulatory out of department at this time escorted by family.

## 2013-12-20 ENCOUNTER — Other Ambulatory Visit (HOSPITAL_COMMUNITY): Payer: Self-pay | Admitting: Emergency Medicine

## 2013-12-20 ENCOUNTER — Ambulatory Visit (HOSPITAL_COMMUNITY): Admit: 2013-12-20 | Payer: Medicare Other

## 2013-12-20 DIAGNOSIS — K8020 Calculus of gallbladder without cholecystitis without obstruction: Secondary | ICD-10-CM

## 2013-12-20 DIAGNOSIS — R1011 Right upper quadrant pain: Secondary | ICD-10-CM

## 2013-12-20 LAB — POC URINE PREG, ED
PREG TEST UR: NEGATIVE
Preg Test, Ur: NEGATIVE

## 2014-03-24 ENCOUNTER — Encounter (HOSPITAL_COMMUNITY): Payer: Self-pay

## 2014-03-24 ENCOUNTER — Emergency Department (HOSPITAL_COMMUNITY)
Admission: EM | Admit: 2014-03-24 | Discharge: 2014-03-25 | Disposition: A | Discharge status: Home / Self Care | Payer: Medicare Other | Attending: Emergency Medicine | Admitting: Emergency Medicine

## 2014-03-24 DIAGNOSIS — J45909 Unspecified asthma, uncomplicated: Secondary | ICD-10-CM | POA: Insufficient documentation

## 2014-03-24 DIAGNOSIS — Z720 Tobacco use: Secondary | ICD-10-CM | POA: Diagnosis not present

## 2014-03-24 DIAGNOSIS — Z3202 Encounter for pregnancy test, result negative: Secondary | ICD-10-CM | POA: Insufficient documentation

## 2014-03-24 DIAGNOSIS — R112 Nausea with vomiting, unspecified: Secondary | ICD-10-CM | POA: Diagnosis present

## 2014-03-24 DIAGNOSIS — K8050 Calculus of bile duct without cholangitis or cholecystitis without obstruction: Secondary | ICD-10-CM | POA: Insufficient documentation

## 2014-03-24 DIAGNOSIS — Z79899 Other long term (current) drug therapy: Secondary | ICD-10-CM | POA: Insufficient documentation

## 2014-03-24 LAB — URINALYSIS, ROUTINE W REFLEX MICROSCOPIC
Bilirubin Urine: NEGATIVE
Glucose, UA: NEGATIVE mg/dL
Hgb urine dipstick: NEGATIVE
Ketones, ur: NEGATIVE mg/dL
NITRITE: NEGATIVE
PH: 6 (ref 5.0–8.0)
Protein, ur: NEGATIVE mg/dL
Specific Gravity, Urine: 1.025 (ref 1.005–1.030)
Urobilinogen, UA: 0.2 mg/dL (ref 0.0–1.0)

## 2014-03-24 LAB — URINE MICROSCOPIC-ADD ON

## 2014-03-24 LAB — POC URINE PREG, ED: Preg Test, Ur: NEGATIVE

## 2014-03-24 MED ORDER — HYDROMORPHONE HCL 1 MG/ML IJ SOLN
1.0000 mg | Freq: Once | INTRAMUSCULAR | Status: AC
  Administered 2014-03-25: 1 mg via INTRAVENOUS
  Filled 2014-03-24: qty 1

## 2014-03-24 MED ORDER — ONDANSETRON HCL 4 MG/2ML IJ SOLN
4.0000 mg | Freq: Once | INTRAMUSCULAR | Status: AC
  Administered 2014-03-25: 4 mg via INTRAVENOUS
  Filled 2014-03-24: qty 2

## 2014-03-24 NOTE — ED Notes (Signed)
Vomiting since this morning,associated with upper abd pain. Hx of gallstones

## 2014-03-25 ENCOUNTER — Encounter (HOSPITAL_COMMUNITY): Payer: Self-pay

## 2014-03-25 DIAGNOSIS — Z880 Allergy status to penicillin: Secondary | ICD-10-CM | POA: Insufficient documentation

## 2014-03-25 DIAGNOSIS — Z79899 Other long term (current) drug therapy: Secondary | ICD-10-CM | POA: Insufficient documentation

## 2014-03-25 DIAGNOSIS — Z8719 Personal history of other diseases of the digestive system: Secondary | ICD-10-CM | POA: Insufficient documentation

## 2014-03-25 DIAGNOSIS — K8050 Calculus of bile duct without cholangitis or cholecystitis without obstruction: Secondary | ICD-10-CM | POA: Diagnosis not present

## 2014-03-25 DIAGNOSIS — Z720 Tobacco use: Secondary | ICD-10-CM | POA: Insufficient documentation

## 2014-03-25 DIAGNOSIS — J45909 Unspecified asthma, uncomplicated: Secondary | ICD-10-CM | POA: Insufficient documentation

## 2014-03-25 DIAGNOSIS — R1013 Epigastric pain: Secondary | ICD-10-CM

## 2014-03-25 LAB — CBC WITH DIFFERENTIAL/PLATELET
BASOS ABS: 0 10*3/uL (ref 0.0–0.1)
Basophils Relative: 0 % (ref 0–1)
EOS PCT: 2 % (ref 0–5)
Eosinophils Absolute: 0.2 10*3/uL (ref 0.0–0.7)
HEMATOCRIT: 34.7 % — AB (ref 36.0–46.0)
HEMOGLOBIN: 11.4 g/dL — AB (ref 12.0–15.0)
LYMPHS PCT: 34 % (ref 12–46)
Lymphs Abs: 3 10*3/uL (ref 0.7–4.0)
MCH: 27.1 pg (ref 26.0–34.0)
MCHC: 32.9 g/dL (ref 30.0–36.0)
MCV: 82.6 fL (ref 78.0–100.0)
MONO ABS: 0.7 10*3/uL (ref 0.1–1.0)
MONOS PCT: 7 % (ref 3–12)
Neutro Abs: 5.1 10*3/uL (ref 1.7–7.7)
Neutrophils Relative %: 57 % (ref 43–77)
Platelets: 424 10*3/uL — ABNORMAL HIGH (ref 150–400)
RBC: 4.2 MIL/uL (ref 3.87–5.11)
RDW: 17.5 % — AB (ref 11.5–15.5)
WBC: 9 10*3/uL (ref 4.0–10.5)

## 2014-03-25 LAB — COMPREHENSIVE METABOLIC PANEL
ALBUMIN: 3.3 g/dL — AB (ref 3.5–5.2)
ALK PHOS: 69 U/L (ref 39–117)
ALT: 18 U/L (ref 0–35)
AST: 19 U/L (ref 0–37)
Anion gap: 11 (ref 5–15)
BUN: 7 mg/dL (ref 6–23)
CHLORIDE: 103 meq/L (ref 96–112)
CO2: 26 mEq/L (ref 19–32)
Calcium: 8.7 mg/dL (ref 8.4–10.5)
Creatinine, Ser: 0.73 mg/dL (ref 0.50–1.10)
GFR calc Af Amer: >90 mL/min (ref >90)
GFR calc non Af Amer: >90 mL/min (ref >90)
Glucose, Bld: 97 mg/dL (ref 70–99)
POTASSIUM: 3.9 meq/L (ref 3.7–5.3)
SODIUM: 140 meq/L (ref 137–147)
TOTAL PROTEIN: 7.5 g/dL (ref 6.0–8.3)
Total Bilirubin: <0.2 mg/dL — ABNORMAL LOW (ref 0.3–1.2)

## 2014-03-25 LAB — LIPASE, BLOOD: Lipase: 17 U/L (ref 11–59)

## 2014-03-25 MED ORDER — OXYCODONE-ACETAMINOPHEN 5-325 MG PO TABS
2.0000 | ORAL_TABLET | Freq: Once | ORAL | Status: AC
Start: 2014-03-25 — End: 2014-03-25
  Administered 2014-03-25: 2 via ORAL
  Filled 2014-03-25: qty 2

## 2014-03-25 NOTE — ED Notes (Signed)
Patient states she is supposed to follow up with Dr. Lovell SheehanJenkins to have her gallbladder removed.

## 2014-03-25 NOTE — ED Notes (Signed)
Patient states she was seen yesterday for abdominal pain and vomiting. Patient states she vomited X1 this morning and wants to be re-evaluated.

## 2014-03-25 NOTE — ED Notes (Signed)
Able to drink water w/o difficulty, no further vomiting

## 2014-03-25 NOTE — ED Provider Notes (Signed)
CSN: [id number]     Arrival date & time 03/24/14  2309 History   First MD Initiated Contact with Patient 03/24/14 2322     Chief Complaint  Patient presents with  . Emesis      Patient is a 23 y.o. female presenting with vomiting. The history is provided by the patient.  Emesis Severity:  Moderate Duration:  12 hours Timing:  Intermittent Progression:  Worsening Chronicity:  New Relieved by:  Nothing Worsened by:  Nothing tried Associated symptoms: abdominal pain and fever   Associated symptoms: no diarrhea   Patient reports she has had multiple episodes of nonbloody vomiting over past 12 hours.  She also reports epigastric and RUQ abdominal pain.  She reports h/o gallstones and this pain feels similar to prior episodes   Past Medical History  Diagnosis Date  . Asthma   . Gall stones     History  Substance Use Topics  . Smoking status: Current Some Day Smoker -- 0.25 packs/day    Types: Cigarettes  . Smokeless tobacco: Not on file  . Alcohol Use: No   OB History    No data available     Review of Systems  Constitutional: Positive for fever.  Respiratory: Negative for cough.   Cardiovascular: Negative for chest pain.  Gastrointestinal: Positive for nausea, vomiting, abdominal pain and constipation. Negative for diarrhea and blood in stool.  Genitourinary: Negative for dysuria, vaginal bleeding and vaginal discharge.  All other systems reviewed and are negative.     Allergies  Kiwi extract; Amoxicillin; and Penicillins  Home Medications   Prior to Admission medications   Medication Sig Start Date End Date Taking? Authorizing Provider  albuterol (PROVENTIL HFA;VENTOLIN HFA) 108 (90 BASE) MCG/ACT inhaler Inhale 1-2 puffs into the lungs every 6 (six) hours as needed for wheezing or shortness of breath. 08/08/13   Sunnie NielsenBrian Opitz, MD  HYDROcodone-acetaminophen (NORCO) 5-325 MG per tablet Take 1-2 tablets by mouth every 6 (six) hours as needed for severe pain. 12/19/13    Doug SouSam Jacubowitz, MD  montelukast (SINGULAIR) 10 MG tablet Take 10 mg by mouth daily as needed (for shortness of breath).  05/20/10   Historical Provider, MD  ondansetron (ZOFRAN) 4 MG tablet Take 1 tablet (4 mg total) by mouth every 8 (eight) hours as needed for nausea or vomiting. 12/18/13   Layla MawKristen N Ward, DO  traMADol (ULTRAM) 50 MG tablet Take 1 tablet (50 mg total) by mouth every 6 (six) hours as needed. 12/18/13   Kristen N Ward, DO   BP 117/53 mmHg  Pulse 101  Temp(Src) 98.4 F (36.9 C) (Oral)  Resp 18  Ht 4\' 11"  (1.499 m)  SpO2 98%  LMP 03/10/2014 (Exact Date) Physical Exam CONSTITUTIONAL: Well developed/well nourished HEAD: Normocephalic/atraumatic EYES: EOMI/PERRL ENMT: Mucous membranes moist NECK: supple no meningeal signs SPINE/BACK:entire spine nontender CV: S1/S2 noted, no murmurs/rubs/gallops noted LUNGS: Lungs are clear to auscultation bilaterally, no apparent distress ABDOMEN: soft, moderate RUQ/epigastric tenderness, no rebound or guarding, bowel sounds noted throughout abdomen GU:no cva tenderness NEURO: Pt is awake/alert/appropriate, moves all extremitiesx4.  No facial droop.   EXTREMITIES: pulses normal/equal, full ROM SKIN: warm, color normal PSYCH: no abnormalities of mood noted, alert and oriented to situation  ED Course  Procedures  Labs Review Labs Reviewed  URINALYSIS, ROUTINE W REFLEX MICROSCOPIC - Abnormal; Notable for the following:    Leukocytes, UA TRACE (*)    All other components within normal limits  COMPREHENSIVE METABOLIC PANEL - Abnormal; Notable for the  following:    Albumin 3.3 (*)    Total Bilirubin <0.2 (*)    All other components within normal limits  CBC WITH DIFFERENTIAL - Abnormal; Notable for the following:    Hemoglobin 11.4 (*)    HCT 34.7 (*)    RDW 17.5 (*)    Platelets 424 (*)    All other components within normal limits  URINE MICROSCOPIC-ADD ON - Abnormal; Notable for the following:    Squamous Epithelial / LPF FEW (*)     Bacteria, UA FEW (*)    All other components within normal limits  LIPASE, BLOOD  POC URINE PREG, ED     Medications  HYDROmorphone (DILAUDID) injection 1 mg (1 mg Intravenous Given 03/25/14 0000)  ondansetron (ZOFRAN) injection 4 mg (4 mg Intravenous Given 03/25/14 0000)    MDM  12:30 AM Pt with known h/o biliary colic/cholelithiasis per previous US imaging She tells me she is supposed to have f/u with gen. Surgery in next 1-2 weeks.   Labs pending at this time 1:53 AM Labs reassuring Pt is well appearing She has had multiple bouts of this pain previously.  I doubt acute cholecystitis I advised need to f/u with surgeon ASAP  Final diagnoses:  Biliary colic    Nursing notes including past medical history and social history reviewed and considered in documentation Labs/vital reviewed myself and considered during evaluation Previous records reviewed and considered     Joya Gaskinsonald W Jeweline Reif, MD 03/25/14 [phone]

## 2014-03-25 NOTE — Discharge Instructions (Signed)
Biliary Colic  °Biliary colic is a steady or irregular pain in the upper abdomen. It is usually under the right side of the rib cage. It happens when gallstones interfere with the normal flow of bile from the gallbladder. Bile is a liquid that helps to digest fats. Bile is made in the liver and stored in the gallbladder. When you eat a meal, bile passes from the gallbladder through the cystic duct and the common bile duct into the small intestine. There, it mixes with partially digested food. If a gallstone blocks either of these ducts, the normal flow of bile is blocked. The muscle cells in the bile duct contract forcefully to try to move the stone. This causes the pain of biliary colic.  °SYMPTOMS  °· A person with biliary colic usually complains of pain in the upper abdomen. This pain can be: °¨ In the center of the upper abdomen just below the breastbone. °¨ In the upper-right part of the abdomen, near the gallbladder and liver. °¨ Spread back toward the right shoulder blade. °· Nausea and vomiting. °· The pain usually occurs after eating. °· Biliary colic is usually triggered by the digestive system's demand for bile. The demand for bile is high after fatty meals. Symptoms can also occur when a person who has been fasting suddenly eats a very large meal. Most episodes of biliary colic pass after 1 to 5 hours. After the most intense pain passes, your abdomen may continue to ache mildly for about 24 hours. °DIAGNOSIS  °After you describe your symptoms, your caregiver will perform a physical exam. He or she will pay attention to the upper right portion of your belly (abdomen). This is the area of your liver and gallbladder. An ultrasound will help your caregiver look for gallstones. Specialized scans of the gallbladder may also be done. Blood tests may be done, especially if you have fever or if your pain persists. °PREVENTION  °Biliary colic can be prevented by controlling the risk factors for gallstones. Some of  these risk factors, such as heredity, increasing age, and pregnancy are a normal part of life. Obesity and a high-fat diet are risk factors you can change through a healthy lifestyle. Women going through menopause who take hormone replacement therapy (estrogen) are also more likely to develop biliary colic. °TREATMENT  °· Pain medication may be prescribed. °· You may be encouraged to eat a fat-free diet. °· If the first episode of biliary colic is severe, or episodes of colic keep retuning, surgery to remove the gallbladder (cholecystectomy) is usually recommended. This procedure can be done through small incisions using an instrument called a laparoscope. The procedure often requires a brief stay in the hospital. Some people can leave the hospital the same day. It is the most widely used treatment in people troubled by painful gallstones. It is effective and safe, with no complications in more than 90% of cases. °· If surgery cannot be done, medication that dissolves gallstones may be used. This medication is expensive and can take months or years to work. Only small stones will dissolve. °· Rarely, medication to dissolve gallstones is combined with a procedure called shock-wave lithotripsy. This procedure uses carefully aimed shock waves to break up gallstones. In many people treated with this procedure, gallstones form again within a few years. °PROGNOSIS  °If gallstones block your cystic duct or common bile duct, you are at risk for repeated episodes of biliary colic. There is also a 25% chance that you will develop   a gallbladder infection(acute cholecystitis), or some other complication of gallstones within 10 to 20 years. If you have surgery, schedule it at a time that is convenient for you and at a time when you are not sick. °HOME CARE INSTRUCTIONS  °· Drink plenty of clear fluids. °· Avoid fatty, greasy or fried foods, or any foods that make your pain worse. °· Take medications as directed. °SEEK MEDICAL  CARE IF:  °· You develop a fever over 100.5° F (38.1° C). °· Your pain gets worse over time. °· You develop nausea that prevents you from eating and drinking. °· You develop vomiting. °SEEK IMMEDIATE MEDICAL CARE IF:  °· You have continuous or severe belly (abdominal) pain which is not relieved with medications. °· You develop nausea and vomiting which is not relieved with medications. °· You have symptoms of biliary colic and you suddenly develop a fever and shaking chills. This may signal cholecystitis. Call your caregiver immediately. °· You develop a yellow color to your skin or the white part of your eyes (jaundice). °Document Released: 09/20/2005 Document Revised: 07/12/2011 Document Reviewed: 11/30/2007 °ExitCare® Patient Information ©2015 [employer]. This information is not intended to replace advice given to you by your health care provider. Make sure you discuss any questions you have with your health care provider. ° °

## 2014-03-26 ENCOUNTER — Emergency Department (HOSPITAL_COMMUNITY)
Admission: EM | Admit: 2014-03-26 | Discharge: 2014-03-26 | Disposition: A | Discharge status: Home / Self Care | Payer: Medicare Other | Source: Home / Self Care | Attending: Emergency Medicine | Admitting: Emergency Medicine

## 2014-03-26 DIAGNOSIS — K8050 Calculus of bile duct without cholangitis or cholecystitis without obstruction: Secondary | ICD-10-CM | POA: Diagnosis not present

## 2014-03-26 DIAGNOSIS — Z8719 Personal history of other diseases of the digestive system: Secondary | ICD-10-CM

## 2014-03-26 DIAGNOSIS — R1013 Epigastric pain: Secondary | ICD-10-CM

## 2014-03-26 LAB — CBC WITH DIFFERENTIAL/PLATELET
Basophils Absolute: 0 10*3/uL (ref 0.0–0.1)
Basophils Relative: 1 % (ref 0–1)
EOS ABS: 0.2 10*3/uL (ref 0.0–0.7)
Eosinophils Relative: 3 % (ref 0–5)
HCT: 33.5 % — ABNORMAL LOW (ref 36.0–46.0)
HEMOGLOBIN: 11 g/dL — AB (ref 12.0–15.0)
LYMPHS ABS: 2.9 10*3/uL (ref 0.7–4.0)
LYMPHS PCT: 36 % (ref 12–46)
MCH: 27.4 pg (ref 26.0–34.0)
MCHC: 32.8 g/dL (ref 30.0–36.0)
MCV: 83.3 fL (ref 78.0–100.0)
MONOS PCT: 7 % (ref 3–12)
Monocytes Absolute: 0.5 10*3/uL (ref 0.1–1.0)
NEUTROS PCT: 53 % (ref 43–77)
Neutro Abs: 4.3 10*3/uL (ref 1.7–7.7)
PLATELETS: 394 10*3/uL (ref 150–400)
RBC: 4.02 MIL/uL (ref 3.87–5.11)
RDW: 17.7 % — ABNORMAL HIGH (ref 11.5–15.5)
WBC: 8 10*3/uL (ref 4.0–10.5)

## 2014-03-26 LAB — COMPREHENSIVE METABOLIC PANEL
ALK PHOS: 68 U/L (ref 39–117)
ALT: 22 U/L (ref 0–35)
ANION GAP: 12 (ref 5–15)
AST: 19 U/L (ref 0–37)
Albumin: 3.3 g/dL — ABNORMAL LOW (ref 3.5–5.2)
BUN: 10 mg/dL (ref 6–23)
CHLORIDE: 101 meq/L (ref 96–112)
CO2: 25 meq/L (ref 19–32)
Calcium: 8.8 mg/dL (ref 8.4–10.5)
Creatinine, Ser: 0.66 mg/dL (ref 0.50–1.10)
GFR calc Af Amer: >90 mL/min (ref >90)
GLUCOSE: 89 mg/dL (ref 70–99)
Potassium: 3.9 mEq/L (ref 3.7–5.3)
SODIUM: 138 meq/L (ref 137–147)
Total Protein: 7.5 g/dL (ref 6.0–8.3)

## 2014-03-26 LAB — LIPASE, BLOOD: Lipase: 18 U/L (ref 11–59)

## 2014-03-26 MED ORDER — HYDROCODONE-ACETAMINOPHEN 5-325 MG PO TABS: 2.0000 | ORAL_TABLET | Freq: Once | ORAL | Status: DC

## 2014-03-26 MED ORDER — ONDANSETRON HCL 4 MG/2ML IJ SOLN
4.0000 mg | Freq: Once | INTRAMUSCULAR | Status: AC
  Administered 2014-03-26: 4 mg via INTRAVENOUS
  Filled 2014-03-26: qty 2

## 2014-03-26 MED ORDER — SODIUM CHLORIDE 0.9 % IV BOLUS (SEPSIS)
1000.0000 mL | Freq: Once | INTRAVENOUS | Status: AC
  Administered 2014-03-26: 1000 mL via INTRAVENOUS

## 2014-03-26 MED ORDER — MORPHINE SULFATE 4 MG/ML IJ SOLN
4.0000 mg | Freq: Once | INTRAMUSCULAR | Status: AC
  Administered 2014-03-26: 4 mg via INTRAVENOUS
  Filled 2014-03-26: qty 1

## 2014-03-26 MED ORDER — HYDROCODONE-ACETAMINOPHEN 5-325 MG PO TABS: 2.0000 | ORAL_TABLET | ORAL | Status: DC | PRN

## 2014-03-26 NOTE — Discharge Instructions (Signed)
Follow-up with your general surgeon to discuss management of your gallstones.  Hydrocodone as prescribed as needed for pain.   Abdominal Pain Many things can cause abdominal pain. Usually, abdominal pain is not caused by a disease and will improve without treatment. It can often be observed and treated at home. Your health care provider will do a physical exam and possibly order blood tests and X-rays to help determine the seriousness of your pain. However, in many cases, more time must pass before a clear cause of the pain can be found. Before that point, your health care provider may not know if you need more testing or further treatment. HOME CARE INSTRUCTIONS  Monitor your abdominal pain for any changes. The following actions may help to alleviate any discomfort you are experiencing:  Only take over-the-counter or prescription medicines as directed by your health care provider.  Do not take laxatives unless directed to do so by your health care provider.  Try a clear liquid diet (broth, tea, or water) as directed by your health care provider. Slowly move to a bland diet as tolerated. SEEK MEDICAL CARE IF:  You have unexplained abdominal pain.  You have abdominal pain associated with nausea or diarrhea.  You have pain when you urinate or have a bowel movement.  You experience abdominal pain that wakes you in the night.  You have abdominal pain that is worsened or improved by eating food.  You have abdominal pain that is worsened with eating fatty foods.  You have a fever. SEEK IMMEDIATE MEDICAL CARE IF:   Your pain does not go away within 2 hours.  You keep throwing up (vomiting).  Your pain is felt only in portions of the abdomen, such as the right side or the left lower portion of the abdomen.  You pass bloody or black tarry stools. MAKE SURE YOU:  Understand these instructions.   Will watch your condition.   Will get help right away if you are not doing well or  get worse.  Document Released: 01/27/2005 Document Revised: 04/24/2013 Document Reviewed: 12/27/2012 [facility] Patient Information 2015 [employer] [place]. This information is not intended to replace advice given to you by your health care provider. Make sure you discuss any questions you have with your health care provider.

## 2014-03-26 NOTE — ED Provider Notes (Signed)
CSN: [id number]     Arrival date & time 03/25/14  2302 History   First MD Initiated Contact with Patient 03/26/14 0208     Chief Complaint  Patient presents with  . Abdominal Pain     (Consider location/radiation/quality/duration/timing/severity/associated sxs/prior Treatment) HPI Comments: Patient is a 23 year old obese female with recently diagnosed gallstones. She tells me she has an appointment with Dr. Lovell SheehanJenkins to discuss surgery. She was here yesterday with complaints of increasing discomfort that she attributes to these gallstones. She had laboratory studies performed yesterday and was discharged when they were unremarkable. She returns today with ongoing pain. She denies fever or chills.  Patient is a 23 y.o. female presenting with abdominal pain. The history is provided by the patient.  Abdominal Pain Pain location:  Epigastric Pain quality: cramping   Pain radiates to:  Does not radiate Pain severity:  Moderate Onset quality:  Sudden Duration:  2 days Timing:  Constant Progression:  Worsening Chronicity:  New Relieved by:  Nothing Worsened by:  Nothing tried Ineffective treatments:  None tried   Past Medical History  Diagnosis Date  . Asthma   . Gall stones    History reviewed. No pertinent past surgical history. History reviewed. No pertinent family history. History  Substance Use Topics  . Smoking status: Current Some Day Smoker -- 0.25 packs/day    Types: Cigarettes  . Smokeless tobacco: Not on file  . Alcohol Use: No   OB History    No data available     Review of Systems  Gastrointestinal: Positive for abdominal pain.  All other systems reviewed and are negative.     Allergies  Kiwi extract; Amoxicillin; and Penicillins  Home Medications   Prior to Admission medications   Medication Sig Start Date End Date Taking? Authorizing Provider  albuterol (PROVENTIL HFA;VENTOLIN HFA) 108 (90 BASE) MCG/ACT inhaler Inhale 1-2 puffs into the lungs every 6  (six) hours as needed for wheezing or shortness of breath. 08/08/13  Yes Sunnie NielsenBrian Opitz, MD  montelukast (SINGULAIR) 10 MG tablet Take 10 mg by mouth daily as needed (for shortness of breath).  05/20/10  Yes Historical Provider, MD   BP 124/57 mmHg  Pulse 88  Temp(Src) 99 F (37.2 C) (Oral)  Resp 18  Ht 4\' 11"  (1.499 m)  Wt 290 lb (131.543 kg)  BMI 58.54 kg/m2  SpO2 99%  LMP 03/10/2014 (Exact Date) Physical Exam  Constitutional: She is oriented to person, place, and time. She appears well-developed and well-nourished. No distress.  HENT:  Head: Normocephalic and atraumatic.  Neck: Normal range of motion. Neck supple.  Cardiovascular: Normal rate and regular rhythm.  Exam reveals no gallop and no friction rub.   No murmur heard. Pulmonary/Chest: Effort normal and breath sounds normal. No respiratory distress. She has no wheezes.  Abdominal: Soft. Bowel sounds are normal. She exhibits no distension. There is tenderness. There is no rebound and no guarding.  There is tenderness to palpation in the epigastric region.  Musculoskeletal: Normal range of motion.  Neurological: She is alert and oriented to person, place, and time.  Skin: Skin is warm and dry. She is not diaphoretic.  Nursing note and vitals reviewed.   ED Course  Procedures (including critical care time) Labs Review Labs Reviewed  CBC WITH DIFFERENTIAL  COMPREHENSIVE METABOLIC PANEL  LIPASE, BLOOD    Imaging Review No results found.   EKG Interpretation None      MDM   Final diagnoses:  None    Workup  reveals no white count, normal LFTs and lipase, and the patient to be afebrile and nontoxic appearing. I see no indication for emergent surgery or admission. She will be discharged with pain medication and follow-up with Dr. Lovell SheehanJenkins to discuss possible cholecystectomy.    Geoffery Lyonsouglas Aasha Dina, MD 03/26/14 [phone]

## 2014-07-17 ENCOUNTER — Emergency Department (HOSPITAL_COMMUNITY)
Admission: EM | Admit: 2014-07-17 | Discharge: 2014-07-17 | Disposition: A | Discharge status: Home / Self Care | Payer: Medicaid Other | Attending: Emergency Medicine | Admitting: Emergency Medicine

## 2014-07-17 ENCOUNTER — Encounter (HOSPITAL_COMMUNITY): Payer: Self-pay | Admitting: *Deleted

## 2014-07-17 DIAGNOSIS — R1012 Left upper quadrant pain: Secondary | ICD-10-CM | POA: Insufficient documentation

## 2014-07-17 DIAGNOSIS — Z79899 Other long term (current) drug therapy: Secondary | ICD-10-CM | POA: Diagnosis not present

## 2014-07-17 DIAGNOSIS — Z720 Tobacco use: Secondary | ICD-10-CM | POA: Insufficient documentation

## 2014-07-17 DIAGNOSIS — J45909 Unspecified asthma, uncomplicated: Secondary | ICD-10-CM | POA: Diagnosis not present

## 2014-07-17 DIAGNOSIS — R1013 Epigastric pain: Secondary | ICD-10-CM

## 2014-07-17 DIAGNOSIS — Z880 Allergy status to penicillin: Secondary | ICD-10-CM | POA: Insufficient documentation

## 2014-07-17 DIAGNOSIS — R1110 Vomiting, unspecified: Secondary | ICD-10-CM | POA: Diagnosis present

## 2014-07-17 DIAGNOSIS — Z3202 Encounter for pregnancy test, result negative: Secondary | ICD-10-CM | POA: Diagnosis not present

## 2014-07-17 DIAGNOSIS — Z8719 Personal history of other diseases of the digestive system: Secondary | ICD-10-CM | POA: Diagnosis not present

## 2014-07-17 DIAGNOSIS — R1011 Right upper quadrant pain: Secondary | ICD-10-CM | POA: Diagnosis not present

## 2014-07-17 LAB — COMPREHENSIVE METABOLIC PANEL
ALBUMIN: 3.4 g/dL — AB (ref 3.5–5.2)
ALT: 18 U/L (ref 0–35)
AST: 18 U/L (ref 0–37)
Alkaline Phosphatase: 63 U/L (ref 39–117)
Anion gap: 6 (ref 5–15)
BILIRUBIN TOTAL: 0.2 mg/dL — AB (ref 0.3–1.2)
BUN: 10 mg/dL (ref 6–23)
CHLORIDE: 107 mmol/L (ref 96–112)
CO2: 24 mmol/L (ref 19–32)
Calcium: 8.8 mg/dL (ref 8.4–10.5)
Creatinine, Ser: 0.56 mg/dL (ref 0.50–1.10)
GFR calc Af Amer: >90 mL/min (ref >90)
GFR calc non Af Amer: >90 mL/min (ref >90)
Glucose, Bld: 117 mg/dL — ABNORMAL HIGH (ref 70–99)
POTASSIUM: 3.9 mmol/L (ref 3.5–5.1)
SODIUM: 137 mmol/L (ref 135–145)
Total Protein: 7.9 g/dL (ref 6.0–8.3)

## 2014-07-17 LAB — URINALYSIS, ROUTINE W REFLEX MICROSCOPIC
BILIRUBIN URINE: NEGATIVE
GLUCOSE, UA: NEGATIVE mg/dL
HGB URINE DIPSTICK: NEGATIVE
Ketones, ur: NEGATIVE mg/dL
Leukocytes, UA: NEGATIVE
Nitrite: NEGATIVE
PH: 8 (ref 5.0–8.0)
Protein, ur: NEGATIVE mg/dL
SPECIFIC GRAVITY, URINE: 1.02 (ref 1.005–1.030)
Urobilinogen, UA: 0.2 mg/dL (ref 0.0–1.0)

## 2014-07-17 LAB — CBC WITH DIFFERENTIAL/PLATELET
Basophils Absolute: 0 10*3/uL (ref 0.0–0.1)
Basophils Relative: 0 % (ref 0–1)
EOS ABS: 0.3 10*3/uL (ref 0.0–0.7)
EOS PCT: 3 % (ref 0–5)
HEMATOCRIT: 36.4 % (ref 36.0–46.0)
Hemoglobin: 11.9 g/dL — ABNORMAL LOW (ref 12.0–15.0)
Lymphocytes Relative: 34 % (ref 12–46)
Lymphs Abs: 2.9 10*3/uL (ref 0.7–4.0)
MCH: 26.9 pg (ref 26.0–34.0)
MCHC: 32.7 g/dL (ref 30.0–36.0)
MCV: 82.2 fL (ref 78.0–100.0)
Monocytes Absolute: 0.6 10*3/uL (ref 0.1–1.0)
Monocytes Relative: 7 % (ref 3–12)
Neutro Abs: 4.9 10*3/uL (ref 1.7–7.7)
Neutrophils Relative %: 56 % (ref 43–77)
PLATELETS: 422 10*3/uL — AB (ref 150–400)
RBC: 4.43 MIL/uL (ref 3.87–5.11)
RDW: 16.9 % — ABNORMAL HIGH (ref 11.5–15.5)
WBC: 8.7 10*3/uL (ref 4.0–10.5)

## 2014-07-17 LAB — PREGNANCY, URINE: PREG TEST UR: NEGATIVE

## 2014-07-17 LAB — LIPASE, BLOOD: Lipase: 19 U/L (ref 11–59)

## 2014-07-17 MED ORDER — PROMETHAZINE HCL 25 MG PO TABS: 25.0000 mg | ORAL_TABLET | Freq: Four times a day (QID) | ORAL | Status: DC | PRN

## 2014-07-17 MED ORDER — ONDANSETRON HCL 4 MG/2ML IJ SOLN
4.0000 mg | Freq: Once | INTRAMUSCULAR | Status: AC
  Administered 2014-07-17: 4 mg via INTRAVENOUS
  Filled 2014-07-17: qty 2

## 2014-07-17 MED ORDER — MORPHINE SULFATE 4 MG/ML IJ SOLN
4.0000 mg | Freq: Once | INTRAMUSCULAR | Status: AC
  Administered 2014-07-17: 4 mg via INTRAVENOUS
  Filled 2014-07-17: qty 1

## 2014-07-17 MED ORDER — FAMOTIDINE 20 MG PO TABS: 20.0000 mg | ORAL_TABLET | Freq: Two times a day (BID) | ORAL | Status: AC

## 2014-07-17 MED ORDER — SODIUM CHLORIDE 0.9 % IV BOLUS (SEPSIS)
1000.0000 mL | Freq: Once | INTRAVENOUS | Status: AC
  Administered 2014-07-17: 1000 mL via INTRAVENOUS

## 2014-07-17 NOTE — ED Provider Notes (Signed)
CSN: [id number]     Arrival date & time 07/17/14  1352 History  This chart was scribed for Donnetta HutchingBrian Ramell Wacha, MD by Luisa DagoPriscilla Tutu, Medical Scribe. This patient was seen in room APA11/[facility]11 and the patient's care was started at 2:20 PM.     Chief Complaint  Patient presents with  . Emesis   The history is provided by the patient and medical records. No language interpreter was used.   HPI Comments: Mindy Clark is a 24 y.o. female with hx of gall stones presents to the Emergency Department complaining of sudden onset intermittent episodes of emesis that started last night around 9PM. Pt is also complaining of associated nausea and abdominal pain. Pt denies any fever, neck pain, sore throat, visual disturbance, CP, cough, SOB, diarrhea, urinary symptoms, back pain, HA, weakness, numbness and rash as associated symptoms.    Past Medical History  Diagnosis Date  . Asthma   . Gall stones    History reviewed. No pertinent past surgical history. History reviewed. No pertinent family history. History  Substance Use Topics  . Smoking status: Current Some Day Smoker -- 0.25 packs/day    Types: Cigarettes  . Smokeless tobacco: Not on file  . Alcohol Use: No   OB History    No data available     Review of Systems  Gastrointestinal: Positive for vomiting.  A complete 10 system review of systems was obtained and all systems are negative except as noted in the HPI and PMH.   Allergies  Kiwi extract; Amoxicillin; and Penicillins  Home Medications   Prior to Admission medications   Medication Sig Start Date End Date Taking? Authorizing Provider  albuterol (PROVENTIL HFA;VENTOLIN HFA) 108 (90 BASE) MCG/ACT inhaler Inhale 1-2 puffs into the lungs every 6 (six) hours as needed for wheezing or shortness of breath. 08/08/13  Yes Sunnie NielsenBrian Opitz, MD  montelukast (SINGULAIR) 10 MG tablet Take 10 mg by mouth daily as needed (for shortness of breath).  05/20/10  Yes Historical Provider, MD  famotidine (PEPCID)  20 MG tablet Take 1 tablet (20 mg total) by mouth 2 (two) times daily. 07/17/14   Donnetta HutchingBrian Lezlee Gills, MD  HYDROcodone-acetaminophen (NORCO) 5-325 MG per tablet Take 2 tablets by mouth every 4 (four) hours as needed. Patient not taking: Reported on 07/17/2014 03/26/14   Geoffery Lyonsouglas Delo, MD  promethazine (PHENERGAN) 25 MG tablet Take 1 tablet (25 mg total) by mouth every 6 (six) hours as needed. 07/17/14   Donnetta HutchingBrian Brandin Dilday, MD   BP 140/81 mmHg  Pulse 109  Temp(Src) 99.4 F (37.4 C) (Oral)  Resp 20  Ht 4\' 11"  (1.499 m)  Wt 290 lb (131.543 kg)  BMI 58.54 kg/m2  SpO2 99%  LMP 06/18/2014  Physical Exam  Constitutional: She is oriented to person, place, and time. She appears well-developed and well-nourished.  obese  HENT:  Head: Normocephalic and atraumatic.  Eyes: Conjunctivae and EOM are normal. Pupils are equal, round, and reactive to light.  Neck: Normal range of motion. Neck supple.  Cardiovascular: Normal rate and regular rhythm.   Pulmonary/Chest: Effort normal and breath sounds normal.  Abdominal: Soft. Bowel sounds are normal. There is tenderness in the epigastric area.  Tenderness to epigastrium and bilaterally to RUQ and LUQ.  Musculoskeletal: Normal range of motion.  Neurological: She is alert and oriented to person, place, and time.  Skin: Skin is warm and dry.  Psychiatric: She has a normal mood and affect. Her behavior is normal.  Nursing note and vitals reviewed.  ED Course  Procedures (including critical care time)  DIAGNOSTIC STUDIES: Oxygen Saturation is 99% on RA, normal by my interpretation.    COORDINATION OF CARE: 2:21 PM- Will order IV fluids, pain medication, anti-nausea medicine. Pt advised of plan for treatment and pt agrees.  Labs Review Labs Reviewed  COMPREHENSIVE METABOLIC PANEL - Abnormal; Notable for the following:    Glucose, Bld 117 (*)    Albumin 3.4 (*)    Total Bilirubin 0.2 (*)    All other components within normal limits  CBC WITH DIFFERENTIAL/PLATELET  - Abnormal; Notable for the following:    Hemoglobin 11.9 (*)    RDW 16.9 (*)    Platelets 422 (*)    All other components within normal limits  PREGNANCY, URINE  URINALYSIS, ROUTINE W REFLEX MICROSCOPIC  LIPASE, BLOOD    Imaging Review No results found.   EKG Interpretation None      MDM   Final diagnoses:  Abdominal pain, epigastric    No acute abdomen. Patient feels better after IV fluids, pain and nausea medication. White count normal. Liver functions normal.  Urinalysis and pregnancy test negative. Discharge medications Phenergan 25 mg and Pepcid 20 mg  I personally performed the services described in this documentation, which was scribed in my presence. The recorded information has been reviewed and is accurate.    Donnetta Hutching, MD 07/17/14 [phone]

## 2014-07-17 NOTE — ED Notes (Signed)
Vomiting since last pm No diarrhea. Abd pain,

## 2014-07-17 NOTE — ED Notes (Signed)
Gave patient ginger ale as requested  

## 2014-07-17 NOTE — Discharge Instructions (Signed)
Tests were good. Clear liquids today. Medication for nausea and stomach irritation.

## 2014-08-12 ENCOUNTER — Emergency Department (HOSPITAL_COMMUNITY)
Admission: EM | Admit: 2014-08-12 | Discharge: 2014-08-13 | Disposition: A | Discharge status: Home / Self Care | Payer: Medicaid Other | Attending: Emergency Medicine | Admitting: Emergency Medicine

## 2014-08-12 ENCOUNTER — Encounter (HOSPITAL_COMMUNITY): Payer: Self-pay | Admitting: Emergency Medicine

## 2014-08-12 DIAGNOSIS — Z720 Tobacco use: Secondary | ICD-10-CM | POA: Insufficient documentation

## 2014-08-12 DIAGNOSIS — R109 Unspecified abdominal pain: Secondary | ICD-10-CM | POA: Diagnosis present

## 2014-08-12 DIAGNOSIS — L02411 Cutaneous abscess of right axilla: Secondary | ICD-10-CM | POA: Diagnosis not present

## 2014-08-12 DIAGNOSIS — K8050 Calculus of bile duct without cholangitis or cholecystitis without obstruction: Secondary | ICD-10-CM | POA: Diagnosis not present

## 2014-08-12 DIAGNOSIS — Z79899 Other long term (current) drug therapy: Secondary | ICD-10-CM | POA: Insufficient documentation

## 2014-08-12 DIAGNOSIS — J45909 Unspecified asthma, uncomplicated: Secondary | ICD-10-CM | POA: Diagnosis not present

## 2014-08-12 DIAGNOSIS — Z880 Allergy status to penicillin: Secondary | ICD-10-CM | POA: Diagnosis not present

## 2014-08-12 MED ORDER — LIDOCAINE-EPINEPHRINE (PF) 2 %-1:200000 IJ SOLN
10.0000 mL | Freq: Once | INTRAMUSCULAR | Status: DC
  Filled 2014-08-12: qty 20

## 2014-08-12 MED ORDER — ONDANSETRON HCL 4 MG/2ML IJ SOLN
4.0000 mg | Freq: Once | INTRAMUSCULAR | Status: AC
  Administered 2014-08-13: 4 mg via INTRAVENOUS
  Filled 2014-08-12: qty 2

## 2014-08-12 MED ORDER — POVIDONE-IODINE 10 % EX SOLN
CUTANEOUS | Status: AC
  Filled 2014-08-12: qty 118

## 2014-08-12 MED ORDER — SODIUM CHLORIDE 0.9 % IV SOLN
INTRAVENOUS | Status: DC
  Administered 2014-08-13: via INTRAVENOUS

## 2014-08-12 MED ORDER — FENTANYL CITRATE 0.05 MG/ML IJ SOLN
100.0000 ug | Freq: Once | INTRAMUSCULAR | Status: AC
  Administered 2014-08-13: 100 ug via INTRAVENOUS
  Filled 2014-08-12: qty 2

## 2014-08-12 MED ORDER — HYDROGEN PEROXIDE 3 % EX SOLN
CUTANEOUS | Status: AC
  Filled 2014-08-12: qty 473

## 2014-08-12 MED ORDER — LIDOCAINE HCL (PF) 1 % IJ SOLN
5.0000 mL | Freq: Once | INTRAMUSCULAR | Status: DC
  Filled 2014-08-12: qty 5

## 2014-08-12 NOTE — ED Provider Notes (Signed)
CSN: [id number]     Arrival date & time 08/12/14  2218 History  This chart was scribed for Paula LibraJohn Mayvis Agudelo, MD by Murriel HopperAlec Bankhead, ED Scribe. This patient was seen in room APA04/[facility]04 and the patient's care was started at 11:27 PM.    Chief Complaint  Patient presents with  . Abdominal Pain      The history is provided by the patient. No language interpreter was used.    HPI Comments: Mindy Clark is a 24 y.o. female with a history of biliary colic due to known gallstones who presents to the Emergency Department complaining of epigastric and RUQ abdominal pain. It began about an hour PTA, is shart in nature, and she rates it 9/10 in severity. It has associated nausea, vomiting, and diarrhea. This episode is worse than most of her episodes of biliary colic which usually only last about ten minutes. Pt also notes a worsening abscess of her right armpit that has been present for about a week.    Past Medical History  Diagnosis Date  . Asthma   . Gall stones    History reviewed. No pertinent past surgical history. History reviewed. No pertinent family history. History  Substance Use Topics  . Smoking status: Current Some Day Smoker -- 0.25 packs/day    Types: Cigarettes  . Smokeless tobacco: Not on file  . Alcohol Use: No   OB History    No data available     Review of Systems  All other systems reviewed and are negative.   Allergies  Kiwi extract; Amoxicillin; and Penicillins  Home Medications   Prior to Admission medications   Medication Sig Start Date End Date Taking? Authorizing Provider  albuterol (PROVENTIL HFA;VENTOLIN HFA) 108 (90 BASE) MCG/ACT inhaler Inhale 1-2 puffs into the lungs every 6 (six) hours as needed for wheezing or shortness of breath. 08/08/13   Sunnie NielsenBrian Opitz, MD  famotidine (PEPCID) 20 MG tablet Take 1 tablet (20 mg total) by mouth 2 (two) times daily. 07/17/14   Donnetta HutchingBrian Cook, MD  HYDROcodone-acetaminophen (NORCO) 5-325 MG per tablet Take 2 tablets by mouth  every 4 (four) hours as needed. Patient not taking: Reported on 07/17/2014 03/26/14   Geoffery Lyonsouglas Delo, MD  montelukast (SINGULAIR) 10 MG tablet Take 10 mg by mouth daily as needed (for shortness of breath).  05/20/10   Historical Provider, MD  promethazine (PHENERGAN) 25 MG tablet Take 1 tablet (25 mg total) by mouth every 6 (six) hours as needed. 07/17/14   Donnetta HutchingBrian Cook, MD   BP 116/76 mmHg  Pulse 107  Temp(Src) 98.5 F (36.9 C) (Oral)  Resp 20  Ht 4\' 11"  (1.499 m)  Wt 296 lb (134.265 kg)  BMI 59.75 kg/m2  SpO2 100%  LMP 07/24/2014 Physical Exam  General: Well-developed, well-nourished female in no acute distress; appearance consistent with age of record HENT: normocephalic; atraumatic Eyes: pupils equal, round and reactive to light; extraocular muscles intact Neck: supple Heart: regular rate and rhythm; no murmurs, rubs or gallops Lungs: clear to auscultation bilaterally Abdomen: soft; nondistended; no masses or hepatosplenomegaly; bowel sounds present; epigastric and RUQ abdominal tenderness Extremities: No deformity; full range of motion; pulses normal Neurologic: Awake, alert and oriented; motor function intact in all extremities and symmetric; no facial droop Skin: Warm and dry; abscess of right axilla  Psychiatric: Normal mood and affect   ED Course  Procedures (including critical care time)  DIAGNOSTIC STUDIES: Oxygen Saturation is 100% on room air, normal by my interpretation.  COORDINATION OF CARE: 11:32 PM Discussed treatment plan with pt at bedside and pt agreed to plan.     MDM   Nursing notes and vitals signs, including pulse oximetry, reviewed.  Summary of this visit's results, reviewed by myself:  Labs:  Results for orders placed or performed during the hospital encounter of 08/12/14 (from the past 24 hour(s))  Comprehensive metabolic panel     Status: Abnormal   Collection Time: 08/12/14 11:50 PM  Result Value Ref Range   Sodium 138 135 - 145 mmol/L    Potassium 3.9 3.5 - 5.1 mmol/L   Chloride 104 96 - 112 mmol/L   CO2 26 19 - 32 mmol/L   Glucose, Bld 101 (H) 70 - 99 mg/dL   BUN 8 6 - 23 mg/dL   Creatinine, Ser 1.61 0.50 - 1.10 mg/dL   Calcium 8.5 8.4 - 09.6 mg/dL   Total Protein 8.0 6.0 - 8.3 g/dL   Albumin 3.5 3.5 - 5.2 g/dL   AST 19 0 - 37 U/L   ALT 20 0 - 35 U/L   Alkaline Phosphatase 65 39 - 117 U/L   Total Bilirubin 0.2 (L) 0.3 - 1.2 mg/dL   GFR calc non Af Amer >90 >90 mL/min   GFR calc Af Amer >90 >90 mL/min   Anion gap 8 5 - 15  Lipase, blood     Status: None   Collection Time: 08/12/14 11:50 PM  Result Value Ref Range   Lipase 19 11 - 59 U/L  CBC with Differential/Platelet     Status: Abnormal   Collection Time: 08/12/14 11:50 PM  Result Value Ref Range   WBC 9.4 4.0 - 10.5 K/uL   RBC 4.27 3.87 - 5.11 MIL/uL   Hemoglobin 11.5 (L) 12.0 - 15.0 g/dL   HCT 04.5 (L) 40.9 - 81.1 %   MCV 82.4 78.0 - 100.0 fL   MCH 26.9 26.0 - 34.0 pg   MCHC 32.7 30.0 - 36.0 g/dL   RDW 91.4 (H) 78.2 - 95.6 %   Platelets 396 150 - 400 K/uL   Neutrophils Relative % 59 43 - 77 %   Neutro Abs 5.5 1.7 - 7.7 K/uL   Lymphocytes Relative 30 12 - 46 %   Lymphs Abs 2.8 0.7 - 4.0 K/uL   Monocytes Relative 8 3 - 12 %   Monocytes Absolute 0.8 0.1 - 1.0 K/uL   Eosinophils Relative 3 0 - 5 %   Eosinophils Absolute 0.3 0.0 - 0.7 K/uL   Basophils Relative 0 0 - 1 %   Basophils Absolute 0.0 0.0 - 0.1 K/uL   1:31 AM Pain now 2 out of 10. Abdomen soft with improved epigastric and right upper quadrant tenderness. Patient refused I&D of her right axillary abscess. The abscess spontaneously ruptured while in the ED but she again refused assistance in draining the pus, enlarging the opening or packing the wound.  I personally performed the services described in this documentation, which was scribed in my presence. The recorded information has been reviewed and is accurate.   Paula Libra, MD 08/13/14 [phone]

## 2014-08-12 NOTE — ED Notes (Signed)
Patient complaining of upper abdominal pain that started tonight. Also reports nausea, vomiting, and diarrhea.

## 2014-08-13 LAB — COMPREHENSIVE METABOLIC PANEL
ALBUMIN: 3.5 g/dL (ref 3.5–5.2)
ALK PHOS: 65 U/L (ref 39–117)
ALT: 20 U/L (ref 0–35)
AST: 19 U/L (ref 0–37)
Anion gap: 8 (ref 5–15)
BUN: 8 mg/dL (ref 6–23)
CHLORIDE: 104 mmol/L (ref 96–112)
CO2: 26 mmol/L (ref 19–32)
Calcium: 8.5 mg/dL (ref 8.4–10.5)
Creatinine, Ser: 0.68 mg/dL (ref 0.50–1.10)
GFR calc non Af Amer: >90 mL/min (ref >90)
Glucose, Bld: 101 mg/dL — ABNORMAL HIGH (ref 70–99)
Potassium: 3.9 mmol/L (ref 3.5–5.1)
Sodium: 138 mmol/L (ref 135–145)
TOTAL PROTEIN: 8 g/dL (ref 6.0–8.3)
Total Bilirubin: 0.2 mg/dL — ABNORMAL LOW (ref 0.3–1.2)

## 2014-08-13 LAB — CBC WITH DIFFERENTIAL/PLATELET
BASOS PCT: 0 % (ref 0–1)
Basophils Absolute: 0 10*3/uL (ref 0.0–0.1)
EOS ABS: 0.3 10*3/uL (ref 0.0–0.7)
EOS PCT: 3 % (ref 0–5)
HCT: 35.2 % — ABNORMAL LOW (ref 36.0–46.0)
Hemoglobin: 11.5 g/dL — ABNORMAL LOW (ref 12.0–15.0)
LYMPHS ABS: 2.8 10*3/uL (ref 0.7–4.0)
LYMPHS PCT: 30 % (ref 12–46)
MCH: 26.9 pg (ref 26.0–34.0)
MCHC: 32.7 g/dL (ref 30.0–36.0)
MCV: 82.4 fL (ref 78.0–100.0)
Monocytes Absolute: 0.8 10*3/uL (ref 0.1–1.0)
Monocytes Relative: 8 % (ref 3–12)
Neutro Abs: 5.5 10*3/uL (ref 1.7–7.7)
Neutrophils Relative %: 59 % (ref 43–77)
Platelets: 396 10*3/uL (ref 150–400)
RBC: 4.27 MIL/uL (ref 3.87–5.11)
RDW: 17 % — ABNORMAL HIGH (ref 11.5–15.5)
WBC: 9.4 10*3/uL (ref 4.0–10.5)

## 2014-08-13 LAB — LIPASE, BLOOD: LIPASE: 19 U/L (ref 11–59)

## 2014-08-13 NOTE — ED Notes (Signed)
Pt refused I/D of abscess under right axilla, EDP Neese made aware.

## 2014-09-08 ENCOUNTER — Encounter (HOSPITAL_COMMUNITY): Payer: Self-pay | Admitting: Emergency Medicine

## 2014-09-08 ENCOUNTER — Emergency Department (HOSPITAL_COMMUNITY)
Admission: EM | Admit: 2014-09-08 | Discharge: 2014-09-09 | Disposition: A | Discharge status: Home / Self Care | Payer: Medicaid Other | Attending: Emergency Medicine | Admitting: Emergency Medicine

## 2014-09-08 DIAGNOSIS — Z8719 Personal history of other diseases of the digestive system: Secondary | ICD-10-CM | POA: Insufficient documentation

## 2014-09-08 DIAGNOSIS — Z760 Encounter for issue of repeat prescription: Secondary | ICD-10-CM | POA: Diagnosis not present

## 2014-09-08 DIAGNOSIS — Z720 Tobacco use: Secondary | ICD-10-CM | POA: Diagnosis not present

## 2014-09-08 DIAGNOSIS — Z79899 Other long term (current) drug therapy: Secondary | ICD-10-CM | POA: Diagnosis not present

## 2014-09-08 DIAGNOSIS — Z880 Allergy status to penicillin: Secondary | ICD-10-CM | POA: Insufficient documentation

## 2014-09-08 DIAGNOSIS — J45901 Unspecified asthma with (acute) exacerbation: Secondary | ICD-10-CM | POA: Insufficient documentation

## 2014-09-08 DIAGNOSIS — J039 Acute tonsillitis, unspecified: Secondary | ICD-10-CM | POA: Insufficient documentation

## 2014-09-08 DIAGNOSIS — J029 Acute pharyngitis, unspecified: Secondary | ICD-10-CM | POA: Diagnosis present

## 2014-09-08 NOTE — ED Notes (Signed)
Patient c/o sore throat since Thursday.   

## 2014-09-09 MED ORDER — AZITHROMYCIN 250 MG PO TABS: 250.0000 mg | ORAL_TABLET | Freq: Every day | ORAL | Status: DC

## 2014-09-09 MED ORDER — MAGIC MOUTHWASH W/LIDOCAINE: 5.0000 mL | Freq: Three times a day (TID) | ORAL | Status: DC | PRN

## 2014-09-09 MED ORDER — ALBUTEROL SULFATE (2.5 MG/3ML) 0.083% IN NEBU: 2.5000 mg | INHALATION_SOLUTION | Freq: Four times a day (QID) | RESPIRATORY_TRACT | Status: AC | PRN

## 2014-09-09 NOTE — ED Provider Notes (Signed)
CSN: [id number]     Arrival date & time 09/08/14  2320 History   First MD Initiated Contact with Patient 09/08/14 2350     Chief Complaint  Patient presents with  . Sore Throat     (Consider location/radiation/quality/duration/timing/severity/associated sxs/prior Treatment) HPI  Mindy Clark is a 24 y.o. female who presents to the Emergency Department complaining of sore throat for three days.  She reports having pain with swallowing and noticed "white patches" to her tonsils.  She states that she frequently has tonsillitis and pain feels similar.  She denies neck pain or stiffness, fever, or unilateral throat pain.  She has not taken any medications for her symptoms.  She also reports hx of asthma and has recently ran out of her albuterol.  She reports chronic wheezing, she denies cough or shortness of breath.    Past Medical History  Diagnosis Date  . Asthma   . Gall stones    History reviewed. No pertinent past surgical history. No family history on file. History  Substance Use Topics  . Smoking status: Current Some Day Smoker -- 0.25 packs/day    Types: Cigarettes  . Smokeless tobacco: Not on file  . Alcohol Use: No   OB History    No data available     Review of Systems  Constitutional: Negative for fever, chills, activity change and appetite change.  HENT: Positive for congestion and sore throat. Negative for drooling, ear pain, facial swelling, trouble swallowing and voice change.   Eyes: Negative for pain and visual disturbance.  Respiratory: Positive for wheezing. Negative for cough and shortness of breath.   Gastrointestinal: Negative for nausea, vomiting and abdominal pain.  Musculoskeletal: Negative for arthralgias, neck pain and neck stiffness.  Skin: Negative for color change and rash.  Neurological: Negative for dizziness, facial asymmetry, speech difficulty, numbness and headaches.  Hematological: Negative for adenopathy.  All other systems reviewed and are  negative.     Allergies  Kiwi extract; Amoxicillin; and Penicillins  Home Medications   Prior to Admission medications   Medication Sig Start Date End Date Taking? Authorizing Provider  albuterol (PROVENTIL) (2.5 MG/3ML) 0.083% nebulizer solution Take 3 mLs (2.5 mg total) by nebulization every 6 (six) hours as needed for wheezing or shortness of breath. 09/09/14   Blinda Turek, PA-C  Alum & Mag Hydroxide-Simeth (MAGIC MOUTHWASH W/LIDOCAINE) SOLN Take 5 mLs by mouth 3 (three) times daily as needed for mouth pain. Swish and spit, do not swallow 09/09/14   Genavive Kubicki, PA-C  azithromycin (ZITHROMAX) 250 MG tablet Take 1 tablet (250 mg total) by mouth daily. Take first 2 tablets together, then 1 every day until finished. 09/09/14   Tiyon Sanor, PA-C  famotidine (PEPCID) 20 MG tablet Take 1 tablet (20 mg total) by mouth 2 (two) times daily. 07/17/14   Donnetta HutchingBrian Cook, MD  HYDROcodone-acetaminophen (NORCO) 5-325 MG per tablet Take 2 tablets by mouth every 4 (four) hours as needed. Patient not taking: Reported on 07/17/2014 03/26/14   Geoffery Lyonsouglas Delo, MD  montelukast (SINGULAIR) 10 MG tablet Take 10 mg by mouth daily as needed (for shortness of breath).  05/20/10   Historical Provider, MD  promethazine (PHENERGAN) 25 MG tablet Take 1 tablet (25 mg total) by mouth every 6 (six) hours as needed. 07/17/14   Donnetta HutchingBrian Cook, MD   BP 124/73 mmHg  Pulse 99  Temp(Src) 98.6 F (37 C) (Oral)  Resp 18  Ht 4\' 11"  (1.499 m)  Wt 294 lb 1.6 oz (133.403  kg)  BMI 59.37 kg/m2  SpO2 99%  LMP 08/27/2014 Physical Exam  Constitutional: She is oriented to person, place, and time. She appears well-developed and well-nourished. No distress.  HENT:  Head: Normocephalic and atraumatic.  Right Ear: Tympanic membrane and ear canal normal.  Left Ear: Tympanic membrane and ear canal normal.  Mouth/Throat: Uvula is midline and mucous membranes are normal. No trismus in the jaw. No uvula swelling. Oropharyngeal exudate, posterior  oropharyngeal edema and posterior oropharyngeal erythema present. No tonsillar abscesses.  Neck: Normal range of motion. Neck supple.  Cardiovascular: Normal rate, regular rhythm and normal heart sounds.   No murmur heard. Pulmonary/Chest: Effort normal. No respiratory distress. She has wheezes. She has no rales.  Few inspiratory wheezes.  No distress.  No rales.  Abdominal: There is no splenomegaly. There is no tenderness.  Musculoskeletal: Normal range of motion.  Lymphadenopathy:    She has no cervical adenopathy.  Neurological: She is alert and oriented to person, place, and time. She exhibits normal muscle tone. Coordination normal.  Skin: Skin is warm and dry.  Nursing note and vitals reviewed.   ED Course  Procedures (including critical care time) Labs Review Labs Reviewed - No data to display  Imaging Review No results found.   EKG Interpretation None      MDM   Final diagnoses:  Tonsillitis  Medication refill   patient is well appearing, airway is patent, vitals stable.  No concerning sx's for PTA.  Pt agrees to close PMD f/u.  Appears stable for d/c     Pauline Ausammy Jak Haggar, PA-C 09/09/14 1144  Devoria AlbeIva Knapp, MD 09/14/14 2259

## 2014-09-09 NOTE — Discharge Instructions (Signed)
Tonsillitis °Tonsillitis is an infection of the throat. This infection causes the tonsils to become red, tender, and puffy (swollen). Tonsils are groups of tissue at the back of your throat. If bacteria caused your infection, antibiotic medicine will be given to you. Sometimes symptoms of tonsillitis can be relieved with the use of steroid medicine. If your tonsillitis is severe and happens often, you may need to get your tonsils removed (tonsillectomy). °HOME CARE  °· Rest and sleep often. °· Drink enough fluids to keep your pee (urine) clear or pale yellow. °· While your throat is sore, eat soft or liquid foods like: °¨ Soup. °¨ Ice cream. °¨ Instant breakfast drinks. °· Eat frozen ice pops. °· Gargle with a warm or cold liquid to help soothe the throat. Gargle with a water and salt mix. Mix 1/4 teaspoon of salt and 1/4 teaspoon of baking soda in 1 cup of water. °· Only take medicines as told by your doctor. °· If you are given medicines (antibiotics), take them as told. Finish them even if you start to feel better. °GET HELP IF: °· You have large, tender lumps in your neck. °· You have a rash. °· You cough up green, yellow-brown, or bloody fluid. °· You cannot swallow liquids or food for 24 hours. °· You notice that only one of your tonsils is swollen. °GET HELP RIGHT AWAY IF:  °· You throw up (vomit). °· You have a very bad headache. °· You have a stiff neck. °· You have chest pain. °· You have trouble breathing or swallowing. °· You have bad throat pain, drooling, or your voice changes. °· You have bad pain not helped by medicine. °· You cannot fully open your mouth. °· You have redness, puffiness, or bad pain in the neck. °· You have a fever. °MAKE SURE YOU:  °· Understand these instructions. °· Will watch your condition. °· Will get help right away if you are not doing well or get worse. °Document Released: 10/06/2007 Document Revised: 04/24/2013 Document Reviewed: 10/06/2012 °[employer]® Patient Information  ©2015 [employer]. This information is not intended to replace advice given to you by your health care provider. Make sure you discuss any questions you have with your health care provider. ° °

## 2014-11-26 ENCOUNTER — Emergency Department (HOSPITAL_COMMUNITY)
Admission: EM | Admit: 2014-11-26 | Discharge: 2014-11-26 | Disposition: A | Discharge status: Home / Self Care | Payer: Medicaid Other | Attending: Emergency Medicine | Admitting: Emergency Medicine

## 2014-11-26 ENCOUNTER — Encounter (HOSPITAL_COMMUNITY): Payer: Self-pay

## 2014-11-26 ENCOUNTER — Emergency Department (HOSPITAL_COMMUNITY): Payer: Medicaid Other

## 2014-11-26 DIAGNOSIS — G8929 Other chronic pain: Secondary | ICD-10-CM | POA: Insufficient documentation

## 2014-11-26 DIAGNOSIS — Z8719 Personal history of other diseases of the digestive system: Secondary | ICD-10-CM | POA: Diagnosis not present

## 2014-11-26 DIAGNOSIS — Z79899 Other long term (current) drug therapy: Secondary | ICD-10-CM | POA: Diagnosis not present

## 2014-11-26 DIAGNOSIS — R079 Chest pain, unspecified: Secondary | ICD-10-CM | POA: Diagnosis present

## 2014-11-26 DIAGNOSIS — Z3202 Encounter for pregnancy test, result negative: Secondary | ICD-10-CM | POA: Insufficient documentation

## 2014-11-26 DIAGNOSIS — J45909 Unspecified asthma, uncomplicated: Secondary | ICD-10-CM | POA: Insufficient documentation

## 2014-11-26 DIAGNOSIS — R0789 Other chest pain: Secondary | ICD-10-CM | POA: Diagnosis not present

## 2014-11-26 DIAGNOSIS — Z880 Allergy status to penicillin: Secondary | ICD-10-CM | POA: Diagnosis not present

## 2014-11-26 DIAGNOSIS — Z720 Tobacco use: Secondary | ICD-10-CM | POA: Insufficient documentation

## 2014-11-26 HISTORY — DX: Malingerer (conscious simulation): Z76.5

## 2014-11-26 HISTORY — DX: Unspecified abdominal pain: R10.9

## 2014-11-26 HISTORY — DX: Other chronic pain: G89.29

## 2014-11-26 LAB — I-STAT CHEM 8, ED
BUN: 8 mg/dL (ref 6–20)
CALCIUM ION: 1.17 mmol/L (ref 1.12–1.23)
Chloride: 103 mmol/L (ref 101–111)
Creatinine, Ser: 0.7 mg/dL (ref 0.44–1.00)
GLUCOSE: 128 mg/dL — AB (ref 65–99)
HCT: 38 % (ref 36.0–46.0)
Hemoglobin: 12.9 g/dL (ref 12.0–15.0)
Potassium: 3.8 mmol/L (ref 3.5–5.1)
Sodium: 140 mmol/L (ref 135–145)
TCO2: 22 mmol/L (ref 0–100)

## 2014-11-26 LAB — I-STAT TROPONIN, ED: Troponin i, poc: 0 ng/mL (ref 0.00–0.08)

## 2014-11-26 LAB — I-STAT BETA HCG BLOOD, ED (MC, WL, AP ONLY): I-stat hCG, quantitative: <5 m[IU]/mL (ref <5)

## 2014-11-26 MED ORDER — METHOCARBAMOL 500 MG PO TABS: 1000.0000 mg | ORAL_TABLET | Freq: Four times a day (QID) | ORAL | Status: AC | PRN

## 2014-11-26 MED ORDER — NAPROXEN 250 MG PO TABS: 250.0000 mg | ORAL_TABLET | Freq: Two times a day (BID) | ORAL | Status: DC | PRN

## 2014-11-26 MED ORDER — CYCLOBENZAPRINE HCL 10 MG PO TABS
10.0000 mg | ORAL_TABLET | Freq: Once | ORAL | Status: AC
  Administered 2014-11-26: 10 mg via ORAL
  Filled 2014-11-26: qty 1

## 2014-11-26 MED ORDER — TRAMADOL HCL 50 MG PO TABS: 50.0000 mg | ORAL_TABLET | Freq: Four times a day (QID) | ORAL | Status: DC | PRN

## 2014-11-26 NOTE — ED Notes (Signed)
Pt alert & oriented x4, stable gait. Patient given discharge instructions, paperwork & prescription(s). Patient  instructed to stop at the registration desk to finish any additional paperwork. Patient verbalized understanding. Pt left department w/ no further questions. 

## 2014-11-26 NOTE — ED Provider Notes (Signed)
CSN: [id number]     Arrival date & time 11/26/14  1808 History   First MD Initiated Contact with Patient 11/26/14 1817     Chief Complaint  Patient presents with  . Chest Pain      HPI Pt was seen at 1840. Per pt, c/o gradual onset and persistence of constant right upper chest wall "pain" that began 2 days ago. Pain worsens with palpation of the area and body position changes. Cannot recall injury. States she has been taking motrin without relief. Denies palpitations, no SOB/cough, no abd pain, no N/V/D, no rash, no fevers.    Past Medical History  Diagnosis Date  . Asthma   . Gall stones   . Drug-seeking behavior   . Chronic abdominal pain    History reviewed. No pertinent past surgical history.  History  Substance Use Topics  . Smoking status: Current Some Day Smoker -- 0.25 packs/day    Types: Cigarettes  . Smokeless tobacco: Not on file  . Alcohol Use: No    Review of Systems ROS: Statement: All systems negative except as marked or noted in the HPI; Constitutional: Negative for fever and chills. ; ; Eyes: Negative for eye pain, redness and discharge. ; ; ENMT: Negative for ear pain, hoarseness, nasal congestion, sinus pressure and sore throat. ; ; Cardiovascular: Negative for palpitations, diaphoresis, dyspnea and peripheral edema. ; ; Respiratory: Negative for cough, wheezing and stridor. ; ; Gastrointestinal: Negative for nausea, vomiting, diarrhea, abdominal pain, blood in stool, hematemesis, jaundice and rectal bleeding. . ; ; Genitourinary: Negative for dysuria, flank pain and hematuria. ; ; Musculoskeletal: +right chest wall pain. Negative for back pain and neck pain. Negative for swelling and trauma.; ; Skin: Negative for pruritus, rash, abrasions, blisters, bruising and skin lesion.; ; Neuro: Negative for headache, lightheadedness and neck stiffness. Negative for weakness, altered level of consciousness , altered mental status, extremity weakness, paresthesias, involuntary  movement, seizure and syncope.      Allergies  Kiwi extract; Amoxicillin; and Penicillins  Home Medications   Prior to Admission medications   Medication Sig Start Date End Date Taking? Authorizing Provider  albuterol (PROVENTIL) (2.5 MG/3ML) 0.083% nebulizer solution Take 3 mLs (2.5 mg total) by nebulization every 6 (six) hours as needed for wheezing or shortness of breath. 09/09/14   Tammy Triplett, PA-C  Alum & Mag Hydroxide-Simeth (MAGIC MOUTHWASH W/LIDOCAINE) SOLN Take 5 mLs by mouth 3 (three) times daily as needed for mouth pain. Swish and spit, do not swallow 09/09/14   Tammy Triplett, PA-C  azithromycin (ZITHROMAX) 250 MG tablet Take 1 tablet (250 mg total) by mouth daily. Take first 2 tablets together, then 1 every day until finished. 09/09/14   Tammy Triplett, PA-C  famotidine (PEPCID) 20 MG tablet Take 1 tablet (20 mg total) by mouth 2 (two) times daily. 07/17/14   Donnetta Hutching, MD  HYDROcodone-acetaminophen (NORCO) 5-325 MG per tablet Take 2 tablets by mouth every 4 (four) hours as needed. Patient not taking: Reported on 07/17/2014 03/26/14   Geoffery Lyons, MD  montelukast (SINGULAIR) 10 MG tablet Take 10 mg by mouth daily as needed (for shortness of breath).  05/20/10   Historical Provider, MD  promethazine (PHENERGAN) 25 MG tablet Take 1 tablet (25 mg total) by mouth every 6 (six) hours as needed. 07/17/14   Donnetta Hutching, MD   BP 143/90 mmHg  Pulse 107  Temp(Src) 98.7 F (37.1 C) (Oral)  Resp 18  Wt 296 lb (134.265 kg)  SpO2 100%  LMP 11/19/2014 Physical Exam  1845: Physical examination:  Nursing notes reviewed; Vital signs and O2 SAT reviewed;  Constitutional: Well developed, Well nourished, Well hydrated, In no acute distress; Head:  Normocephalic, atraumatic; Eyes: EOMI, PERRL, No scleral icterus; ENMT: Mouth and pharynx normal, Mucous membranes moist; Neck: Supple, Full range of motion, No lymphadenopathy; Cardiovascular: Regular rate and rhythm, No gallop; Respiratory: Breath sounds  coarse & equal bilaterally.  Speaking full sentences with ease, Normal respiratory effort/excursion; Chest: +right upper anterior chest wall tender to palp. No rash, no deformity, no soft tissue crepitus. Movement normal; Abdomen: Soft, Nontender, Nondistended, Normal bowel sounds; Genitourinary: No CVA tenderness; Extremities: Pulses normal, No tenderness, No edema, No calf edema or asymmetry.; Neuro: AA&Ox3, Major CN grossly intact.  Speech clear. No gross focal motor or sensory deficits in extremities.; Skin: Color normal, Warm, Dry.   ED Course  Procedures     EKG Interpretation   Date/Time:  Tuesday November 26 2014 18:20:49 EDT Ventricular Rate:  97 PR Interval:  145 QRS Duration: 85 QT Interval:  347 QTC Calculation: 441 R Axis:   82 Text Interpretation:  Sinus rhythm When compared with ECG of 11/21/2013 No  significant change was found Confirmed by [facility]  MD, Nicholos Johns [phone]) on  11/26/2014 6:50:39 PM      MDM  MDM Reviewed: previous chart, nursing note and vitals Reviewed previous: labs and ECG Interpretation: labs, ECG and x-ray     Results for orders placed or performed during the hospital encounter of 11/26/14  I-stat Chem 8, ED  Result Value Ref Range   Sodium 140 135 - 145 mmol/L   Potassium 3.8 3.5 - 5.1 mmol/L   Chloride 103 101 - 111 mmol/L   BUN 8 6 - 20 mg/dL   Creatinine, Ser 6.04 0.44 - 1.00 mg/dL   Glucose, Bld 540 (H) 65 - 99 mg/dL   Calcium, Ion 9.81 1.91 - 1.23 mmol/L   TCO2 22 0 - 100 mmol/L   Hemoglobin 12.9 12.0 - 15.0 g/dL   HCT 47.8 29.5 - 62.1 %  I-stat troponin, ED  Result Value Ref Range   Troponin i, poc 0.00 0.00 - 0.08 ng/mL   Comment 3          I-Stat beta hCG blood, ED  Result Value Ref Range   I-stat hCG, quantitative <5.0 <5 mIU/mL   Comment 3           Dg Chest 2 View 11/26/2014   CLINICAL DATA:  Right-sided chest pain for 2 days worse with movement  EXAM: CHEST  2 VIEW  COMPARISON:  08/11/2013  FINDINGS: The heart size and  mediastinal contours are within normal limits. Both lungs are clear. The visualized skeletal structures are unremarkable.  IMPRESSION: No active cardiopulmonary disease.   Electronically Signed   By: Esperanza Heir M.D.   On: 11/26/2014 19:13    1945:  Doubt PE as cause for symptoms with low risk [place].  Doubt ACS as cause for symptoms with normal troponin and unchanged EKG from previous after 2 days of constant symptoms. Tx symptomatically for msk pain at this time. Dx and testing d/w pt.  Questions answered.  Verb understanding, agreeable to d/c home with outpt f/u.  2000:  As pt was being discharged:  Pt requests eval of "rectal bleeding" for the past 2 days. Pt states she sees "drips of blood" after having a BM. Cannot recall if she passed a large or hard stool before the rectal bleeding began.  H/H is stable. Rectal exam performed w/permission of pt and ED RN chaperone present.  Anal tone normal.  Non-tender, soft brown stool.  +recal fissure without active bleeding. No external hemorrhoids, no palp masses. Tx instructions given regarding same. States she is ready for d/c now. Denies any further complaints.    Samuel Jester, DO 11/29/14 [phone]

## 2014-11-26 NOTE — Discharge Instructions (Signed)
°Emergency Department Resource Guide °1) Find a Doctor and Pay Out of Pocket °Although you won't have to find out who is covered by your insurance plan, it is a good idea to ask around and get recommendations. You will then need to call the office and see if the doctor you have chosen will accept you as a new patient and what types of options they offer for patients who are self-pay. Some doctors offer discounts or will set up payment plans for their patients who do not have insurance, but you will need to ask so you aren't surprised when you get to your appointment. ° °2) Contact Your Local Health Department °Not all health departments have doctors that can see patients for sick visits, but many do, so it is worth a call to see if yours does. If you don't know where your local health department is, you can check in your phone book. The CDC also has a tool to help you locate your state's health department, and many state websites also have listings of all of their local health departments. ° °3) Find a Walk-in Clinic °If your illness is not likely to be very severe or complicated, you may want to try a walk in clinic. These are popping up all over the country in pharmacies, drugstores, and shopping centers. They're usually staffed by nurse practitioners or physician assistants that have been trained to treat common illnesses and complaints. They're usually fairly quick and inexpensive. However, if you have serious medical issues or chronic medical problems, these are probably not your best option. ° °No Primary Care Doctor: °- Call [facility] at  832-8000 - they can help you locate a primary care doctor that  accepts your insurance, provides certain services, etc. °- Physician Referral Service- 1-800-533-3463 ° °Chronic Pain Problems: °Organization         Address  Phone   Notes  °[facility]  (336) 297-2271 Patients need to be referred by their primary care doctor.  ° °Medication  Assistance: °Organization         Address  Phone   Notes  °[place] [facility] [address] °[place], [place] [address] (336) 641-8030 --Must be a resident of [place] °-- Must have NO insurance coverage whatsoever (no Medicaid/ Medicare, etc.) °-- The pt. MUST have a primary care doctor that directs their care regularly and follows them in the community °  °[facility]  (866) 331-1348   °[employer]  (888) 892-1162   ° °Agencies that provide inexpensive medical care: °Organization         Address  Phone   Notes  °[facility]  (336) 832-8035   °[facility]    (336) 832-7272   °[facility] [address] °[place], [place] [address] (336) 832-4777   °[facility] [address], °[place] (336) 271-4999   °[facility]    (336) 373-0678   °[facility]    (336) 272-1050   °[facility] ° [address], [place] Phone:  (336) 832-4444, Fax:  (336) 832-4440 Hours of Operation:  9 am - 6 pm, M-F.  Also accepts Medicaid/Medicare and self-pay.  °[facility] ° [address],  Phone: (336) 832-3150, Fax: (336) 832-3151. Hours of Operation:  8:30 am - 5:30 pm, M-F.  Also accepts Medicaid and self-pay.  °[facility] [place] [address], [place] Phone: (336) 878-6027   °[facility] [address], [place], [place] (336)723-1848, Ext. 123 Mondays & Thursdays: 7-9 AM.  First 15 patients are seen on a first come, first serve basis. °  ° °Medicaid-accepting [place] Providers: ° °Organization         Address  Phone   Notes  °[facility] [address], [place] (336) 641-2100 Also accepts self-pay patients.  °[facility] [address], [place] ° (336) 856-9996   °[facility] [address], [place]  (336) 288-8857   °[facility] [address] [place] [address], [place] (336) 299-7000   °Veita Bland [address], [place]  ° (336) 373-1557 Only accepts [place] Access Medicaid patients after they have their name applied to their card.  ° °Self-Pay (no insurance) in [place]: ° °Organization         Address  Phone   Notes  °Sickle Cell Patients, [facility] [address], [place] (336) 832-1970   °[facility] [address], [place] (336) 832-4400   °[facility] ° [address],  (336) 992-4800   °[facility]/Dr. Osei-Bonsu ° [address] [place] [address], [place] or [address], [place] (336) 841-8500 Phone number for both [place] and [place] locations is the same.  °[facility] [address], [place] (336) 299-0000   °[facility] [address] [place] [address], [place] or [address] (336) 852-7530 °(336) 878-2260   °[facility] [address] [place] [address], [place] (336) 350-1642, phone; (336) 294-5005, fax Sees patients 1st and 3rd Saturday of every month.  Must not qualify for public or private insurance (i.e. Medicaid, Medicare, [place] Health Choice, Veterans' Benefits) • Household income should be no more than 200% of the poverty level •The clinic cannot treat you if you are pregnant or think you are pregnant • Sexually transmitted diseases are not treated at the clinic.  ° ° °Dental Care: °Organization         Address  Phone  Notes  °[place] [facility] [address], [place] (336) 641-6152 Accepts children up to age 21 who are enrolled in Medicaid or [place] Health Choice; pregnant women with a Medicaid card; and children who have applied for Medicaid or [place] Health Choice, but were declined, whose parents can pay a reduced fee at time of service.  °[place]  [facility] [place]  [address], [place] (336) 641-7733 Accepts children up to age 21 who are enrolled in Medicaid or [place] Health Choice; pregnant women with a Medicaid card; and children who have applied for Medicaid or [place] Health Choice, but were declined, whose parents can pay a reduced fee at time of service.  °[facility] ° [address],  (336) 641-4533 Patients are seen by appointment only. Walk-ins are not accepted. [facility] will see patients 18 years of age and older. °Monday - Tuesday (8am-5pm) °Most Wednesdays (8:30-5pm) °$30 per visit, cash only  °[facility] ° [address], [place] (336) 641-4533 Patients are seen by appointment only. Walk-ins are not accepted. [facility] will see patients 18 years of age and older. °One   Wednesday Evening (Monthly: Volunteer Based).  $30 per visit, cash only  °[facility]  (919) 537-3737 for adults; Children under age 4, call Graduate Pediatric Dentistry at ([phone]) [phone]. Children aged 4-14, please call ([phone]) [phone] to request a pediatric application. ° Dental services are provided in all areas of dental care including fillings, crowns and bridges, complete and partial dentures, implants, gum treatment, root canals, and extractions. Preventive care is also provided. Treatment is provided to both adults and children. °Patients are selected via a lottery and there is often a waiting list. °  °[facility] [address], °[place] ° (336) 763-8833 [email or web address] °  °[facility] [address], [place], [place] (336)723-1848, Ext. 123 Second and Fourth Thursday of each month, opens at 6:30 AM; Clinic ends at 9 AM.  Patients are seen on a first-come first-served basis, and a limited number are seen during each clinic.  ° °[facility] ° [address], [place], [place] (336) 723-7904    Eligibility Requirements °You must have lived in [place], [place], or [place] counties for at least the last three months. °  You cannot be eligible for state or federal sponsored healthcare insurance, including Veterans Administration, Medicaid, or Medicare. °  You generally cannot be eligible for healthcare insurance through your employer.  °  How to apply: °Eligibility screenings are held every Tuesday and Wednesday afternoon from 1:00 pm until 4:00 pm. You do not need an appointment for the interview!  °[facility] [address], [place], [place] 336-631-2330   °[facility]  336-342-8273   °[facility]  336-703-3100   °[facility]  336-570-6415   ° °Behavioral Health Resources in the Community: °Intensive Outpatient Programs °Organization         Address  Phone  Notes  °[facility] [address], [place], [place] 336-878-6098   °[facility] [address], [place], [place] 336-832-9800   °[facility] [address], [place], [place] ° 336-882-2125   °[facility] [address],  °[place], [place] 1-800-853-5163 or 336-641-4981   °Substance Abuse Resources °Organization         Address  Phone  Notes  °[facility]  336-882-2125   °[facility]  336-784-9470   °[facility]  336-285-9073   °[facility]  336-845-3988   °Residential & Outpatient Substance Abuse Program  1-800-659-3381   °Psychological Services °Organization         Address  Phone  Notes  °[facility]  336- 832-9600   °[facility]  336- 378-7881   °[facility] [address], [place] 1-800-853-5163 or 336-641-4981   ° °Mobile Crisis Teams °Organization         Address  Phone  Notes  °[facility], Mobile Crisis Care Unit  1-877-626-1772   °Assertive °[facility] ° [address]  [place], [place] 336-834-9664   °Sharon DeEsch [address] °[place] 336-554-5454   ° °Self-Help/Support Groups °Organization         Address  Phone             Notes  °[facility] [place] - variety of support groups  336- 373-1402 Call for more information  °Narcotics Anonymous (NA), [facility] [address], °[place]  2 meetings at this location  ° °  Residential Treatment Programs °Organization         Address  Phone  Notes  °[facility] [address],    °[place]  1-866-801-8205   °[facility] ° [address], [place], [place] 704-293-8524   °[facility] [address], [place] 336-845-3988 Admissions: 8am-3pm M-F  °[facility] [address]    °[place], [place] 336-841-1104   °[facility] [address], [place], [place] 336-379-7146   °[facility] [address]  °[place], [place] 336-285-9073   °[facility] Intensive Outpatient [address], [place], [place] 336-852-3033   °[facility] [address]  °[place], [place] 1-877-615-2722 or 336-784-9470   °[facility] [address] , [place] 336-227-7417 Accepts Medicaid  °[facility] [address]  °[place] 1-800-659-3381 Substance Abuse/Addiction Treatment  ° °[place] Behavioral Health Resources °Organization         Address  Phone  Notes  °[facility]  (888) 581-9988   °Julie Brannon, PhD [address] [place], [place]   (336) 349-5553 or (336) 951-0000   °[facility]   [address] °[place], [place] (336) 349-4454   °[facility] [address], [place], [place] (336) 342-8316 Insurance/Medicaid/sponsorship through [facility]  °[facility] [address]                                    [place], [place] (336) 342-8316 Therapy/tele-psych/case    °[facility] [address]  ° [place], [place] (336) 349-2233    °Dr. Arfeen  (336) 349-4544   °[facility] [place]  [employer] [place] [facility] 1) [address], [place] °2) [address], [place] °3)  [address], [place] (336) 349-3220 °(336) 342-7768 ° °(336) 342-8140   °[place] [facility] (336) 342-1394 or (336) 342-3537 (After Hours)    ° ° °Take the prescriptions as directed.  Apply moist heat or ice to the area(s) of discomfort, for 15 minutes at a time, several times per day for the next few days.  Do not fall asleep on a heating or ice pack.  Call your regular medical doctor tomorrow to schedule a follow up appointment this week.  Return to the Emergency Department immediately if worsening. ° °

## 2014-11-26 NOTE — ED Notes (Signed)
Pt reports r side of chest hurting x 2 days.  Reports pain is worse when she moves, pain is worse.  Denies cough or fever.  Pt also reports has noticed drips of blood in stool for the past couple of days.

## 2014-12-22 ENCOUNTER — Emergency Department (HOSPITAL_COMMUNITY)
Admission: EM | Admit: 2014-12-22 | Discharge: 2014-12-22 | Disposition: A | Discharge status: Home / Self Care | Payer: Medicaid Other | Attending: Emergency Medicine | Admitting: Emergency Medicine

## 2014-12-22 ENCOUNTER — Encounter (HOSPITAL_COMMUNITY): Payer: Self-pay | Admitting: Emergency Medicine

## 2014-12-22 DIAGNOSIS — N61 Inflammatory disorders of breast: Secondary | ICD-10-CM | POA: Diagnosis present

## 2014-12-22 DIAGNOSIS — Z880 Allergy status to penicillin: Secondary | ICD-10-CM | POA: Diagnosis not present

## 2014-12-22 DIAGNOSIS — G8929 Other chronic pain: Secondary | ICD-10-CM | POA: Insufficient documentation

## 2014-12-22 DIAGNOSIS — J45901 Unspecified asthma with (acute) exacerbation: Secondary | ICD-10-CM | POA: Diagnosis not present

## 2014-12-22 DIAGNOSIS — Z8719 Personal history of other diseases of the digestive system: Secondary | ICD-10-CM | POA: Diagnosis not present

## 2014-12-22 DIAGNOSIS — R0789 Other chest pain: Secondary | ICD-10-CM | POA: Insufficient documentation

## 2014-12-22 DIAGNOSIS — Z79899 Other long term (current) drug therapy: Secondary | ICD-10-CM | POA: Insufficient documentation

## 2014-12-22 DIAGNOSIS — L0291 Cutaneous abscess, unspecified: Secondary | ICD-10-CM

## 2014-12-22 MED ORDER — SULFAMETHOXAZOLE-TRIMETHOPRIM 800-160 MG PO TABS: 1.0000 | ORAL_TABLET | Freq: Two times a day (BID) | ORAL | Status: AC

## 2014-12-22 MED ORDER — PREDNISONE 10 MG PO TABS: ORAL_TABLET | ORAL | Status: AC

## 2014-12-22 MED ORDER — TRAMADOL HCL 50 MG PO TABS
50.0000 mg | ORAL_TABLET | Freq: Four times a day (QID) | ORAL | Status: AC | PRN
Start: 2014-12-22 — End: ?

## 2014-12-22 MED ORDER — IPRATROPIUM-ALBUTEROL 0.5-2.5 (3) MG/3ML IN SOLN
3.0000 mL | Freq: Once | RESPIRATORY_TRACT | Status: AC
  Administered 2014-12-22: 3 mL via RESPIRATORY_TRACT
  Filled 2014-12-22: qty 3

## 2014-12-22 MED ORDER — ALBUTEROL SULFATE (2.5 MG/3ML) 0.083% IN NEBU
5.0000 mg | INHALATION_SOLUTION | Freq: Once | RESPIRATORY_TRACT | Status: AC
  Administered 2014-12-22: 5 mg via RESPIRATORY_TRACT

## 2014-12-22 MED ORDER — PREDNISONE 50 MG PO TABS
60.0000 mg | ORAL_TABLET | Freq: Once | ORAL | Status: AC
  Administered 2014-12-22: 60 mg via ORAL
  Filled 2014-12-22 (×2): qty 1

## 2014-12-22 MED ORDER — ALBUTEROL SULFATE (2.5 MG/3ML) 0.083% IN NEBU
2.5000 mg | INHALATION_SOLUTION | Freq: Once | RESPIRATORY_TRACT | Status: AC
  Administered 2014-12-22: 2.5 mg via RESPIRATORY_TRACT
  Filled 2014-12-22: qty 3

## 2014-12-22 MED ORDER — ALBUTEROL SULFATE (2.5 MG/3ML) 0.083% IN NEBU
INHALATION_SOLUTION | RESPIRATORY_TRACT | Status: AC
  Filled 2014-12-22: qty 6

## 2014-12-22 NOTE — Discharge Instructions (Signed)
Abscess An abscess (boil or furuncle) is an infected area on or under the skin. This area is filled with yellowish-white fluid (pus) and other material (debris). HOME CARE   Only take medicines as told by your doctor.  If you were given antibiotic medicine, take it as directed. Finish the medicine even if you start to feel better.  If gauze is used, follow your doctor's directions for changing the gauze.  To avoid spreading the infection:  Keep your abscess covered with a bandage.  Wash your hands well.  Do not share personal care items, towels, or whirlpools with others.  Avoid skin contact with others.  Keep your skin and clothes clean around the abscess.  Keep all doctor visits as told. GET HELP RIGHT AWAY IF:   You have more pain, puffiness (swelling), or redness in the wound site.  You have more fluid or blood coming from the wound site.  You have muscle aches, chills, or you feel sick.  You have a fever. MAKE SURE YOU:   Understand these instructions.  Will watch your condition.  Will get help right away if you are not doing well or get worse. Document Released: 10/06/2007 Document Revised: 10/19/2011 Document Reviewed: 07/02/2011 [facility] Patient Information 2015 [place], [place]. This information is not intended to replace advice given to you by your health care provider. Make sure you discuss any questions you have with your health care provider.  Asthma, Acute Bronchospasm Acute bronchospasm caused by asthma is also referred to as an asthma attack. Bronchospasm means your air passages become narrowed. The narrowing is caused by inflammation and tightening of the muscles in the air tubes (bronchi) in your lungs. This can make it hard to breathe or cause you to wheeze and cough. CAUSES Possible triggers are:  Animal dander from the skin, hair, or feathers of animals.  Dust mites contained in house dust.  Cockroaches.  Pollen from trees or  grass.  Mold.  Cigarette or tobacco smoke.  Air pollutants such as dust, household cleaners, hair sprays, aerosol sprays, paint fumes, strong chemicals, or strong odors.  Cold air or weather changes. Cold air may trigger inflammation. Winds increase molds and pollens in the air.  Strong emotions such as crying or laughing hard.  Stress.  Certain medicines such as aspirin or beta-blockers.  Sulfites in foods and drinks, such as dried fruits and wine.  Infections or inflammatory conditions, such as a flu, cold, or inflammation of the nasal membranes (rhinitis).  Gastroesophageal reflux disease (GERD). GERD is a condition where stomach acid backs up into your esophagus.  Exercise or strenuous activity. SIGNS AND SYMPTOMS   Wheezing.  Excessive coughing, particularly at night.  Chest tightness.  Shortness of breath. DIAGNOSIS  Your health care provider will ask you about your medical history and perform a physical exam. A chest X-ray or blood testing may be performed to look for other causes of your symptoms or other conditions that may have triggered your asthma attack. TREATMENT  Treatment is aimed at reducing inflammation and opening up the airways in your lungs. Most asthma attacks are treated with inhaled medicines. These include quick relief or rescue medicines (such as bronchodilators) and controller medicines (such as inhaled corticosteroids). These medicines are sometimes given through an inhaler or a nebulizer. Systemic steroid medicine taken by mouth or given through an IV tube also can be used to reduce the inflammation when an attack is moderate or severe. Antibiotic medicines are only used if a bacterial infection  is present.  HOME CARE INSTRUCTIONS   Rest.  Drink plenty of liquids. This helps the mucus to remain thin and be easily coughed up. Only use caffeine in moderation and do not use alcohol until you have recovered from your illness.  Do not smoke. Avoid  being exposed to secondhand smoke.  You play a critical role in keeping yourself in good health. Avoid exposure to things that cause you to wheeze or to have breathing problems.  Keep your medicines up-to-date and available. Carefully follow your health care provider's treatment plan.  Take your medicine exactly as prescribed.  When pollen or pollution is bad, keep windows closed and use an air conditioner or go to places with air conditioning.  Asthma requires careful medical care. See your health care provider for a follow-up as advised. If you are more than [redacted] weeks pregnant and you were prescribed any new medicines, let your obstetrician know about the visit and how you are doing. Follow up with your health care provider as directed.  After you have recovered from your asthma attack, make an appointment with your outpatient doctor to talk about ways to reduce the likelihood of future attacks. If you do not have a doctor who manages your asthma, make an appointment with a primary care doctor to discuss your asthma. SEEK IMMEDIATE MEDICAL CARE IF:   You are getting worse.  You have trouble breathing. If severe, call your local emergency services (911 in the [place]  You develop chest pain or discomfort.  You are vomiting.  You are not able to keep fluids down.  You are coughing up yellow, green, brown, or bloody sputum.  You have a fever and your symptoms suddenly get worse.  You have trouble swallowing. MAKE SURE YOU:   Understand these instructions.  Will watch your condition.  Will get help right away if you are not doing well or get worse. Document Released: 08/04/2006 Document Revised: 04/24/2013 Document Reviewed: 10/25/2012 [facility] Patient Information 2015 [place], [place]. This information is not intended to replace advice given to you by your health care provider. Make sure you discuss any questions you have with your health care provider.

## 2014-12-22 NOTE — ED Provider Notes (Signed)
CSN: [id number]     Arrival date & time 12/22/14  1306 History  This chart was scribed for non-physician practitioner Pauline Aus, PA-C working with Samuel Jester, DO by Lyndel Safe, ED Scribe. This patient was seen in room APFT24/APFT24 and the patient's care was started at 1:38 PM.  Chief Complaint  Patient presents with  . Cyst    fold of left breast.   The history is provided by the patient. No language interpreter was used.    HPI Comments: Mindy Clark is a 24 y.o. female, with a PMhx of asthma and abscesses, who presents to the Emergency Department complaining of a gradually worsening area of pain and erythema to the medial, lower aspect of her left breast with onset 2 weeks ago. She reports the pain is worse with movement of her left upper extremity. Pt notes a history of abscesses. Per family member the pt is followed by a PCP, Augustine Radar, NP. She denies the affected area to be pruritic, drainage from the area, or experiencing fevers. The pt additionally complains of chest tightness that is exacerbated with deep breathing, which she was evaluated for in the ED several weeks ago. Pt uses a home nebulizer for asthma; last treatment was 1 week ago.   Past Medical History  Diagnosis Date  . Asthma   . Gall stones   . Drug-seeking behavior   . Chronic abdominal pain    History reviewed. No pertinent past surgical history. History reviewed. No pertinent family history. Social History  Substance Use Topics  . Smoking status: Current Some Day Smoker -- 0.25 packs/day    Types: Cigarettes  . Smokeless tobacco: None  . Alcohol Use: No   OB History    No data available     Review of Systems  Constitutional: Negative for fever.  Respiratory: Positive for chest tightness and wheezing.   Gastrointestinal: Negative for nausea and vomiting.  Skin: Positive for color change (abscess left chest).  Neurological: Negative for weakness, numbness and headaches.  All other  systems reviewed and are negative.  Allergies  Kiwi extract; Amoxicillin; and Penicillins  Home Medications   Prior to Admission medications   Medication Sig Start Date End Date Taking? Authorizing Provider  albuterol (PROVENTIL) (2.5 MG/3ML) 0.083% nebulizer solution Take 3 mLs (2.5 mg total) by nebulization every 6 (six) hours as needed for wheezing or shortness of breath. 09/09/14   Kaymarie Wynn, PA-C  Alum & Mag Hydroxide-Simeth (MAGIC MOUTHWASH W/LIDOCAINE) SOLN Take 5 mLs by mouth 3 (three) times daily as needed for mouth pain. Swish and spit, do not swallow 09/09/14   Terrie Haring, PA-C  azithromycin (ZITHROMAX) 250 MG tablet Take 1 tablet (250 mg total) by mouth daily. Take first 2 tablets together, then 1 every day until finished. 09/09/14   Marcheta Horsey, PA-C  famotidine (PEPCID) 20 MG tablet Take 1 tablet (20 mg total) by mouth 2 (two) times daily. 07/17/14   Donnetta Hutching, MD  HYDROcodone-acetaminophen (NORCO) 5-325 MG per tablet Take 2 tablets by mouth every 4 (four) hours as needed. Patient not taking: Reported on 07/17/2014 03/26/14   Geoffery Lyons, MD  methocarbamol (ROBAXIN) 500 MG tablet Take 2 tablets (1,000 mg total) by mouth 4 (four) times daily as needed for muscle spasms (muscle spasm/pain). 11/26/14   Samuel Jester, DO  montelukast (SINGULAIR) 10 MG tablet Take 10 mg by mouth daily as needed (for shortness of breath).  05/20/10   Historical Provider, MD  naproxen (NAPROSYN) 250 MG tablet  Take 1 tablet (250 mg total) by mouth 2 (two) times daily as needed for mild pain or moderate pain (take with food). 11/26/14   Samuel Jester, DO  promethazine (PHENERGAN) 25 MG tablet Take 1 tablet (25 mg total) by mouth every 6 (six) hours as needed. 07/17/14   Donnetta Hutching, MD  traMADol (ULTRAM) 50 MG tablet Take 1 tablet (50 mg total) by mouth every 6 (six) hours as needed. 11/26/14   Samuel Jester, DO   BP 116/62 mmHg  Pulse 97  Temp(Src) 98.1 F (36.7 C) (Oral)  Resp 16  Ht 4\' 11"   (1.499 m)  Wt 297 lb 12.8 oz (135.081 kg)  BMI 60.12 kg/m2  SpO2 98%  LMP 12/22/2014 Physical Exam  Constitutional: She appears well-developed and well-nourished. No distress.  HENT:  Head: Normocephalic.  Neck: No JVD present.  Cardiovascular: Normal rate, regular rhythm, normal heart sounds and intact distal pulses.   No murmur heard. Pulmonary/Chest: Effort normal. No respiratory distress. She has wheezes.  Diminished lung sounds bilaterally with inspiratory wheezes.   Neurological: She is alert. Coordination normal.  Skin: Skin is warm. No rash noted. No erythema. No pallor.  3 cm area of induration to the medial aspect of the left chest wall. Mild erythema. Several smaller satellite pustules present. No drainage or fluctuance.   Psychiatric: She has a normal mood and affect. Her behavior is normal.  Nursing note and vitals reviewed.   ED Course  Procedures  DIAGNOSTIC STUDIES: Oxygen Saturation is 98% on RA, normal by my interpretation.    COORDINATION OF CARE: 1:43 PM Discussed treatment plan with pt. Advised pt to follow up with her PCP. Will order albuterol breathing tx and prednisone. Pt acknowledges and agrees to plan.    MDM   Final diagnoses:  Abscess  Asthma exacerbation, mild    Small likely early abscess to the left chest wall. Does not appear to involve the breast.  Pt had audible wheezes which likely relates to her chest tightness.  A&A neb given x 2.  Wheezing greatly improved, prednisone given.  Will tx with prednisone taper, bactrim and tramadol for pain.  Pt appears stable for d/c  I personally performed the services described in this documentation, which was scribed in my presence. The recorded information has been reviewed and is accurate.    Pauline Aus, PA-C 12/24/14 2210  Samuel Jester, DO 12/25/14 [phone]

## 2014-12-22 NOTE — ED Notes (Signed)
Notice small pimple under left breast about 2 weeks ago.  Area has gotten larger and painful.  C/o tender to upper right chest wall.  Rates pain 10/10.  Did not take any pain medication.

## 2015-04-01 ENCOUNTER — Encounter (HOSPITAL_COMMUNITY): Payer: Self-pay | Admitting: *Deleted

## 2015-04-01 ENCOUNTER — Emergency Department (HOSPITAL_COMMUNITY)
Admission: EM | Admit: 2015-04-01 | Discharge: 2015-04-02 | Disposition: A | Discharge status: Home / Self Care | Payer: Medicaid Other | Attending: Emergency Medicine | Admitting: Emergency Medicine

## 2015-04-01 DIAGNOSIS — J45909 Unspecified asthma, uncomplicated: Secondary | ICD-10-CM | POA: Diagnosis not present

## 2015-04-01 DIAGNOSIS — F17210 Nicotine dependence, cigarettes, uncomplicated: Secondary | ICD-10-CM | POA: Insufficient documentation

## 2015-04-01 DIAGNOSIS — Z3202 Encounter for pregnancy test, result negative: Secondary | ICD-10-CM | POA: Diagnosis not present

## 2015-04-01 DIAGNOSIS — R112 Nausea with vomiting, unspecified: Secondary | ICD-10-CM

## 2015-04-01 DIAGNOSIS — R197 Diarrhea, unspecified: Secondary | ICD-10-CM | POA: Diagnosis not present

## 2015-04-01 DIAGNOSIS — Z79899 Other long term (current) drug therapy: Secondary | ICD-10-CM | POA: Insufficient documentation

## 2015-04-01 DIAGNOSIS — Z7952 Long term (current) use of systemic steroids: Secondary | ICD-10-CM | POA: Insufficient documentation

## 2015-04-01 DIAGNOSIS — Z794 Long term (current) use of insulin: Secondary | ICD-10-CM | POA: Diagnosis not present

## 2015-04-01 DIAGNOSIS — R739 Hyperglycemia, unspecified: Secondary | ICD-10-CM | POA: Insufficient documentation

## 2015-04-01 DIAGNOSIS — Z7289 Other problems related to lifestyle: Secondary | ICD-10-CM | POA: Insufficient documentation

## 2015-04-01 DIAGNOSIS — R109 Unspecified abdominal pain: Secondary | ICD-10-CM | POA: Insufficient documentation

## 2015-04-01 DIAGNOSIS — G8929 Other chronic pain: Secondary | ICD-10-CM | POA: Insufficient documentation

## 2015-04-01 DIAGNOSIS — R1110 Vomiting, unspecified: Secondary | ICD-10-CM | POA: Diagnosis present

## 2015-04-01 DIAGNOSIS — Z8719 Personal history of other diseases of the digestive system: Secondary | ICD-10-CM | POA: Diagnosis not present

## 2015-04-01 LAB — COMPREHENSIVE METABOLIC PANEL
ALT: 15 U/L (ref 14–54)
AST: 14 U/L — ABNORMAL LOW (ref 15–41)
Albumin: 3.6 g/dL (ref 3.5–5.0)
Alkaline Phosphatase: 82 U/L (ref 38–126)
Anion gap: 13 (ref 5–15)
BUN: <5 mg/dL — ABNORMAL LOW (ref 6–20)
CHLORIDE: 96 mmol/L — AB (ref 101–111)
CO2: 21 mmol/L — ABNORMAL LOW (ref 22–32)
CREATININE: 0.74 mg/dL (ref 0.44–1.00)
Calcium: 8.9 mg/dL (ref 8.9–10.3)
Glucose, Bld: 594 mg/dL (ref 65–99)
POTASSIUM: 3.7 mmol/L (ref 3.5–5.1)
Sodium: 130 mmol/L — ABNORMAL LOW (ref 135–145)
TOTAL PROTEIN: 7.1 g/dL (ref 6.5–8.1)
Total Bilirubin: 0.4 mg/dL (ref 0.3–1.2)

## 2015-04-01 LAB — URINE MICROSCOPIC-ADD ON: WBC UA: NONE SEEN WBC/hpf (ref 0–5)

## 2015-04-01 LAB — CBC
HEMATOCRIT: 41 % (ref 36.0–46.0)
Hemoglobin: 13.4 g/dL (ref 12.0–15.0)
MCH: 27.4 pg (ref 26.0–34.0)
MCHC: 32.7 g/dL (ref 30.0–36.0)
MCV: 83.8 fL (ref 78.0–100.0)
PLATELETS: 309 10*3/uL (ref 150–400)
RBC: 4.89 MIL/uL (ref 3.87–5.11)
RDW: 16.5 % — ABNORMAL HIGH (ref 11.5–15.5)
WBC: 5.4 10*3/uL (ref 4.0–10.5)

## 2015-04-01 LAB — URINALYSIS, ROUTINE W REFLEX MICROSCOPIC
Bilirubin Urine: NEGATIVE
Ketones, ur: 15 mg/dL — AB
LEUKOCYTES UA: NEGATIVE
NITRITE: NEGATIVE
PROTEIN: NEGATIVE mg/dL
Specific Gravity, Urine: 1.044 — ABNORMAL HIGH (ref 1.005–1.030)
pH: 6 (ref 5.0–8.0)

## 2015-04-01 LAB — LIPASE, BLOOD: LIPASE: 23 U/L (ref 11–51)

## 2015-04-01 LAB — POC URINE PREG, ED: Preg Test, Ur: NEGATIVE

## 2015-04-01 MED ORDER — ONDANSETRON 4 MG PO TBDP
4.0000 mg | ORAL_TABLET | Freq: Once | ORAL | Status: AC | PRN
  Administered 2015-04-01: 4 mg via ORAL

## 2015-04-01 MED ORDER — ONDANSETRON 4 MG PO TBDP
ORAL_TABLET | ORAL | Status: AC
  Filled 2015-04-01: qty 1

## 2015-04-01 NOTE — ED Notes (Signed)
Pt in c/o n/v/d with abd pain for the last three days, started after eating [employer] a few days ago, last vomited PTA, reports constant abdominal cramping, no distress noted

## 2015-04-02 LAB — CBG MONITORING, ED
GLUCOSE-CAPILLARY: 371 mg/dL — AB (ref 65–99)
GLUCOSE-CAPILLARY: 124 mg/dL — AB (ref 65–99)

## 2015-04-02 MED ORDER — SODIUM CHLORIDE 0.9 % IV BOLUS (SEPSIS)
1000.0000 mL | Freq: Once | INTRAVENOUS | Status: AC
  Administered 2015-04-02: 1000 mL via INTRAVENOUS

## 2015-04-02 MED ORDER — KETOROLAC TROMETHAMINE 30 MG/ML IJ SOLN
30.0000 mg | Freq: Once | INTRAMUSCULAR | Status: AC
  Administered 2015-04-02: 30 mg via INTRAVENOUS
  Filled 2015-04-02: qty 1

## 2015-04-02 MED ORDER — INSULIN ASPART 100 UNIT/ML ~~LOC~~ SOLN
10.0000 [IU] | Freq: Once | SUBCUTANEOUS | Status: AC
  Administered 2015-04-02: 10 [IU] via INTRAVENOUS
  Filled 2015-04-02: qty 1

## 2015-04-02 MED ORDER — ONDANSETRON 4 MG PO TBDP: 4.0000 mg | ORAL_TABLET | Freq: Three times a day (TID) | ORAL | Status: AC | PRN

## 2015-04-02 MED ORDER — ONDANSETRON HCL 4 MG/2ML IJ SOLN
4.0000 mg | Freq: Once | INTRAMUSCULAR | Status: DC
  Filled 2015-04-02: qty 2

## 2015-04-02 NOTE — ED Provider Notes (Signed)
By signing my name below, I, Arlan Organ, attest that this documentation has been prepared under the direction and in the presence of Kristen N Ward, DO.  Electronically Signed: Arlan Organ, ED Scribe. 04/02/2015. 12:34 AM.   TIME SEEN: 12:29 AM   CHIEF COMPLAINT:  Chief Complaint  Patient presents with  . Emesis    HPI:  HPI Comments: Mindy Clark is a 24 y.o. female with a PMHx of insulin dependent DM and gallstones who presents to the Emergency Department complaining of constant, ongoing vomiting x 3 days, Ongoing diarrhea, nausea, and abdominal pain. Abdominal pain described as cramping. Pt states symptoms began after eating a [employer] hamburger, chicken nuggets, french fries and chicken sandwich from the day before. No aggravating or alleviating factors at this time. No OTC medications or home remedies attempted prior to arrival. No recent fever, chills, nausea, vomiting, dysuria, vaginal discharge, or vaginal bleeding. Mindy Clark takes 10 units of insulin 30 minutes prior to every meal. However, she states "i don't think i'm taking my insulin right". She states she cannot remember what her insulin is. She thinks it's "starts with a 70". LNMP beginning of November. States her PCP is in [facility] where she is from and plans to go back this week. No history of DKA. History of abdominal surgery.  PCP: No PCP Per Patient     ROS: See HPI Constitutional: no fever  Eyes: no drainage  ENT: no runny nose   Cardiovascular:  no chest pain  Resp: no SOB  GI: Positive vomiting, nausea, diarrhea, and abdominal pain GU: no dysuria Integumentary: no rash  Allergy: no hives  Musculoskeletal: no leg swelling  Neurological: no slurred speech ROS otherwise negative  PAST MEDICAL HISTORY/PAST SURGICAL HISTORY:  Past Medical History  Diagnosis Date  . Asthma   . Gall stones   . Drug-seeking behavior   . Chronic abdominal pain     MEDICATIONS:  Prior to Admission  medications   Medication Sig Start Date End Date Taking? Authorizing Provider  acetaminophen (TYLENOL) 500 MG tablet Take 1,500 mg by mouth every 6 (six) hours as needed for moderate pain or headache.    Historical Provider, MD  albuterol (PROVENTIL) (2.5 MG/3ML) 0.083% nebulizer solution Take 3 mLs (2.5 mg total) by nebulization every 6 (six) hours as needed for wheezing or shortness of breath. 09/09/14   Tammy Triplett, PA-C  Aspirin-Salicylamide-Caffeine (BC HEADACHE POWDER PO) Take 1 Package by mouth daily as needed (headache).    Historical Provider, MD  famotidine (PEPCID) 20 MG tablet Take 1 tablet (20 mg total) by mouth 2 (two) times daily. 07/17/14   Donnetta Hutching, MD  methocarbamol (ROBAXIN) 500 MG tablet Take 2 tablets (1,000 mg total) by mouth 4 (four) times daily as needed for muscle spasms (muscle spasm/pain). Patient not taking: Reported on 12/22/2014 11/26/14   Samuel Jester, DO  montelukast (SINGULAIR) 10 MG tablet Take 10 mg by mouth daily as needed (for shortness of breath).  05/20/10   Historical Provider, MD  predniSONE (DELTASONE) 10 MG tablet Take 6 tablets day one, 5 tablets day two, 4 tablets day three, 3 tablets day four, 2 tablets day five, then 1 tablet day six 12/22/14   Tammy Triplett, PA-C  traMADol (ULTRAM) 50 MG tablet Take 1 tablet (50 mg total) by mouth every 6 (six) hours as needed. 12/22/14   Tammy Triplett, PA-C    ALLERGIES:  Allergies  Allergen Reactions  . Kiwi Extract Shortness Of Breath and Swelling  Throat and lip swelling  . Amoxicillin Hives  . Penicillins Hives    SOCIAL HISTORY:  Social History  Substance Use Topics  . Smoking status: Current Some Day Smoker -- 0.25 packs/day    Types: Cigarettes  . Smokeless tobacco: Not on file  . Alcohol Use: No    FAMILY HISTORY: History reviewed. No pertinent family history.  EXAM: BP 117/80 mmHg  Pulse 104  Temp(Src) 98.7 F (37.1 C) (Oral)  Resp 18  Ht 5' (1.524 m)  Wt 270 lb (122.471 kg)  BMI  52.73 kg/m2  SpO2 100%  LMP 03/11/2015 CONSTITUTIONAL: Alert and oriented and responds appropriately to questions. Well-appearing; well-nourished. Obese. HEAD: Normocephalic EYES: Conjunctivae clear, PERRL ENT: normal nose; no rhinorrhea; moist mucous membranes; pharynx without lesions noted NECK: Supple, no meningismus, no LAD  CARD: Regular and tachycardic; S1 and S2 appreciated; no murmurs, no clicks, no rubs, no gallops RESP: Normal chest excursion without splinting or tachypnea; breath sounds clear and equal bilaterally; no wheezes, no rhonchi, no rales, no hypoxia or respiratory distress, speaking full sentences ABD/GI: Normal bowel sounds; non-distended; soft, minimally tender to palpation diffusely, no rebound, no guarding, no peritoneal signs. Has chronic non infected 4x4 superficial wound in LLQ BACK:  The back appears normal and is non-tender to palpation, there is no CVA tenderness EXT: Normal ROM in all joints; non-tender to palpation; no edema; normal capillary refill; no cyanosis, no calf tenderness or swelling    SKIN: Normal color for age and race; warm NEURO: Moves all extremities equally, sensation to light touch intact diffusely, cranial nerves II through XII intact PSYCH: The patient's mood and manner are appropriate. Grooming and personal hygiene are appropriate.  MEDICAL DECISION MAKING: Patient here with nausea, vomiting and diarrhea. Possible viral illness.  She is found to be hyperglycemic. Bicarbonate is slightly low but her anion gap is normal. She has a small amount of ketones in her urine. I do not think she is in DKA. She is no longer vomiting or having diarrhea in the ED. Her abdominal exam is benign. We'll give IV fluids, Zofran and insulin. I do not feel she needs emergent abdominal imaging.  ED PROGRESS: Patient's repeat abdominal exam is still benign. Reports feeling better after Zofran and Toradol. Blood glucose is now 124. She states she once did a home so she  can eat. She's been able to tolerate fluids in the emergency department without vomiting. Have discussed with patient at length how to change her diet given she is eating very poorly which is likely contracting her hyperglycemia and have recommended that she follow-up with her PCP this week in [place] to discuss what her insulin regimen should be. She states she has plenty of insulin at home. Discussed return precautions. She verbalized understanding and is comfortable with this plan. We'll discharge with Zofran for nausea. Have advised her to use Imodium for diarrhea as needed.      I personally performed the services described in this documentation, which was scribed in my presence. The recorded information has been reviewed and is accurate.   Layla MawKristen N Ward, DO 04/02/15 [phone]

## 2015-04-02 NOTE — ED Notes (Signed)
Pt CBG result was 371. Informed RN.

## 2015-04-02 NOTE — ED Notes (Signed)
Pt c.o. "food poisoning for three days after eating [employer]." Pt reports vomiting and diarrhea. No vomiting since being given zofran in triage.

## 2015-04-02 NOTE — ED Notes (Signed)
Pt now reporting she is not nauseated and only wants pain medication through her IV for pain to abdomen.

## 2015-04-02 NOTE — Discharge Instructions (Signed)
Please take your insulin as prescribed. I recommend that you avoid sugar, large amounts of carbohydrates.   Hyperglycemia Hyperglycemia occurs when the glucose (sugar) in your blood is too high. Hyperglycemia can happen for many reasons, but it most often happens to people who do not know they have diabetes or are not managing their diabetes properly.  CAUSES  Whether you have diabetes or not, there are other causes of hyperglycemia. Hyperglycemia can occur when you have diabetes, but it can also occur in other situations that you might not be as aware of, such as: Diabetes  If you have diabetes and are having problems controlling your blood glucose, hyperglycemia could occur because of some of the following reasons:  Not following your meal plan.  Not taking your diabetes medications or not taking it properly.  Exercising less or doing less activity than you normally do.  Being sick. Pre-diabetes  This cannot be ignored. Before people develop Type 2 diabetes, they almost always have "pre-diabetes." This is when your blood glucose levels are higher than normal, but not yet high enough to be diagnosed as diabetes. Research has shown that some long-term damage to the body, especially the heart and circulatory system, may already be occurring during pre-diabetes. If you take action to manage your blood glucose when you have pre-diabetes, you may delay or prevent Type 2 diabetes from developing. Stress  If you have diabetes, you may be "diet" controlled or on oral medications or insulin to control your diabetes. However, you may find that your blood glucose is higher than usual in the hospital whether you have diabetes or not. This is often referred to as "stress hyperglycemia." Stress can elevate your blood glucose. This happens because of hormones put out by the body during times of stress. If stress has been the cause of your high blood glucose, it can be followed regularly by your caregiver.  That way he/she can make sure your hyperglycemia does not continue to get worse or progress to diabetes. Steroids  Steroids are medications that act on the infection fighting system (immune system) to block inflammation or infection. One side effect can be a rise in blood glucose. Most people can produce enough extra insulin to allow for this rise, but for those who cannot, steroids make blood glucose levels go even higher. It is not unusual for steroid treatments to "uncover" diabetes that is developing. It is not always possible to determine if the hyperglycemia will go away after the steroids are stopped. A special blood test called an A1c is sometimes done to determine if your blood glucose was elevated before the steroids were started. SYMPTOMS  Thirsty.  Frequent urination.  Dry mouth.  Blurred vision.  Tired or fatigue.  Weakness.  Sleepy.  Tingling in feet or leg. DIAGNOSIS  Diagnosis is made by monitoring blood glucose in one or all of the following ways:  A1c test. This is a chemical found in your blood.  Fingerstick blood glucose monitoring.  Laboratory results. TREATMENT  First, knowing the cause of the hyperglycemia is important before the hyperglycemia can be treated. Treatment may include, but is not be limited to:  Education.  Change or adjustment in medications.  Change or adjustment in meal plan.  Treatment for an illness, infection, etc.  More frequent blood glucose monitoring.  Change in exercise plan.  Decreasing or stopping steroids.  Lifestyle changes. HOME CARE INSTRUCTIONS   Test your blood glucose as directed.  Exercise regularly. Your caregiver will give you  instructions about exercise. Pre-diabetes or diabetes which comes on with stress is helped by exercising.  Eat wholesome, balanced meals. Eat often and at regular, fixed times. Your caregiver or nutritionist will give you a meal plan to guide your sugar intake.  Being at an ideal  weight is important. If needed, losing as little as 10 to 15 pounds may help improve blood glucose levels. SEEK MEDICAL CARE IF:   You have questions about medicine, activity, or diet.  You continue to have symptoms (problems such as increased thirst, urination, or weight gain). SEEK IMMEDIATE MEDICAL CARE IF:   You are vomiting or have diarrhea.  Your breath smells fruity.  You are breathing faster or slower.  You are very sleepy or incoherent.  You have numbness, tingling, or pain in your feet or hands.  You have chest pain.  Your symptoms get worse even though you have been following your caregiver's orders.  If you have any other questions or concerns.   This information is not intended to replace advice given to you by your health care provider. Make sure you discuss any questions you have with your health care provider.   Document Released: 10/13/2000 Document Revised: 07/12/2011 Document Reviewed: 12/24/2014 Elsevier Interactive Patient Education 2016 [employer]    Nausea and Vomiting Nausea is a sick feeling that often comes before throwing up (vomiting). Vomiting is a reflex where stomach contents come out of your mouth. Vomiting can cause severe loss of body fluids (dehydration). Children and elderly adults can become dehydrated quickly, especially if they also have diarrhea. Nausea and vomiting are symptoms of a condition or disease. It is important to find the cause of your symptoms. CAUSES   Direct irritation of the stomach lining. This irritation can result from increased acid production (gastroesophageal reflux disease), infection, food poisoning, taking certain medicines (such as nonsteroidal anti-inflammatory drugs), alcohol use, or tobacco use.  Signals from the brain.These signals could be caused by a headache, heat exposure, an inner ear disturbance, increased pressure in the brain from injury, infection, a tumor, or a concussion, pain, emotional  stimulus, or metabolic problems.  An obstruction in the gastrointestinal tract (bowel obstruction).  Illnesses such as diabetes, hepatitis, gallbladder problems, appendicitis, kidney problems, cancer, sepsis, atypical symptoms of a heart attack, or eating disorders.  Medical treatments such as chemotherapy and radiation.  Receiving medicine that makes you sleep (general anesthetic) during surgery. DIAGNOSIS Your caregiver may ask for tests to be done if the problems do not improve after a few days. Tests may also be done if symptoms are severe or if the reason for the nausea and vomiting is not clear. Tests may include:  Urine tests.  Blood tests.  Stool tests.  Cultures (to look for evidence of infection).  X-rays or other imaging studies. Test results can help your caregiver make decisions about treatment or the need for additional tests. TREATMENT You need to stay well hydrated. Drink frequently but in small amounts.You may wish to drink water, sports drinks, clear broth, or eat frozen ice pops or gelatin dessert to help stay hydrated.When you eat, eating slowly may help prevent nausea.There are also some antinausea medicines that may help prevent nausea. HOME CARE INSTRUCTIONS   Take all medicine as directed by your caregiver.  If you do not have an appetite, do not force yourself to eat. However, you must continue to drink fluids.  If you have an appetite, eat a normal diet unless your caregiver tells you differently.  Eat a variety of complex carbohydrates (rice, wheat, potatoes, bread), lean meats, yogurt, fruits, and vegetables.  Avoid high-fat foods because they are more difficult to digest.  Drink enough water and fluids to keep your urine clear or pale yellow.  If you are dehydrated, ask your caregiver for specific rehydration instructions. Signs of dehydration may include:  Severe thirst.  Dry lips and mouth.  Dizziness.  Dark urine.  Decreasing urine  frequency and amount.  Confusion.  Rapid breathing or pulse. SEEK IMMEDIATE MEDICAL CARE IF:   You have blood or brown flecks (like coffee grounds) in your vomit.  You have black or bloody stools.  You have a severe headache or stiff neck.  You are confused.  You have severe abdominal pain.  You have chest pain or trouble breathing.  You do not urinate at least once every 8 hours.  You develop cold or clammy skin.  You continue to vomit for longer than 24 to 48 hours.  You have a fever. MAKE SURE YOU:   Understand these instructions.  Will watch your condition.  Will get help right away if you are not doing well or get worse.   This information is not intended to replace advice given to you by your health care provider. Make sure you discuss any questions you have with your health care provider.   Document Released: 04/19/2005 Document Revised: 07/12/2011 Document Reviewed: 09/16/2010 Elsevier Interactive Patient Education 2016 [employer]  Diarrhea Diarrhea is frequent loose and watery bowel movements. It can cause you to feel weak and dehydrated. Dehydration can cause you to become tired and thirsty, have a dry mouth, and have decreased urination that often is dark yellow. Diarrhea is a sign of another problem, most often an infection that will not last long. In most cases, diarrhea typically lasts 2-3 days. However, it can last longer if it is a sign of something more serious. It is important to treat your diarrhea as directed by your caregiver to lessen or prevent future episodes of diarrhea. CAUSES  Some common causes include:  Gastrointestinal infections caused by viruses, bacteria, or parasites.  Food poisoning or food allergies.  Certain medicines, such as antibiotics, chemotherapy, and laxatives.  Artificial sweeteners and fructose.  Digestive disorders. HOME CARE INSTRUCTIONS  Ensure adequate fluid intake (hydration): Have 1 cup (8 oz) of fluid for  each diarrhea episode. Avoid fluids that contain simple sugars or sports drinks, fruit juices, whole milk products, and sodas. Your urine should be clear or pale yellow if you are drinking enough fluids. Hydrate with an oral rehydration solution that you can purchase at pharmacies, retail stores, and online. You can prepare an oral rehydration solution at home by mixing the following ingredients together:   - tsp table salt.   tsp baking soda.   tsp salt substitute containing potassium chloride.  1  tablespoons sugar.  1 L (34 oz) of water.  Certain foods and beverages may increase the speed at which food moves through the gastrointestinal (GI) tract. These foods and beverages should be avoided and include:  Caffeinated and alcoholic beverages.  High-fiber foods, such as raw fruits and vegetables, nuts, seeds, and whole grain breads and cereals.  Foods and beverages sweetened with sugar alcohols, such as xylitol, sorbitol, and mannitol.  Some foods may be well tolerated and may help thicken stool including:  Starchy foods, such as rice, toast, pasta, low-sugar cereal, oatmeal, grits, baked potatoes, crackers, and bagels.  Bananas.  Applesauce.  Add probiotic-rich  foods to help increase healthy bacteria in the GI tract, such as yogurt and fermented milk products.  Wash your hands well after each diarrhea episode.  Only take over-the-counter or prescription medicines as directed by your caregiver.  Take a warm bath to relieve any burning or pain from frequent diarrhea episodes. SEEK IMMEDIATE MEDICAL CARE IF:   You are unable to keep fluids down.  You have persistent vomiting.  You have blood in your stool, or your stools are black and tarry.  You do not urinate in 6-8 hours, or there is only a small amount of very dark urine.  You have abdominal pain that increases or localizes.  You have weakness, dizziness, confusion, or light-headedness.  You have a severe  headache.  Your diarrhea gets worse or does not get better.  You have a fever or persistent symptoms for more than 2-3 days.  You have a fever and your symptoms suddenly get worse. MAKE SURE YOU:   Understand these instructions.  Will watch your condition.  Will get help right away if you are not doing well or get worse.   This information is not intended to replace advice given to you by your health care provider. Make sure you discuss any questions you have with your health care provider.   Document Released: 04/09/2002 Document Revised: 05/10/2014 Document Reviewed: 12/26/2011 Elsevier Interactive Patient Education 2016 [employer].   Diabetes Mellitus and Food It is important for you to manage your blood sugar (glucose) level. Your blood glucose level can be greatly affected by what you eat. Eating healthier foods in the appropriate amounts throughout the day at about the same time each day will help you control your blood glucose level. It can also help slow or prevent worsening of your diabetes mellitus. Healthy eating may even help you improve the level of your blood pressure and reach or maintain a healthy weight.  General recommendations for healthful eating and cooking habits include:  Eating meals and snacks regularly. Avoid going long periods of time without eating to lose weight.  Eating a diet that consists mainly of plant-based foods, such as fruits, vegetables, nuts, legumes, and whole grains.  Using low-heat cooking methods, such as baking, instead of high-heat cooking methods, such as deep frying. Work with your dietitian to make sure you understand how to use the Nutrition Facts information on food labels. HOW CAN FOOD AFFECT ME? Carbohydrates Carbohydrates affect your blood glucose level more than any other type of food. Your dietitian will help you determine how many carbohydrates to eat at each meal and teach you how to count carbohydrates. Counting  carbohydrates is important to keep your blood glucose at a healthy level, especially if you are using insulin or taking certain medicines for diabetes mellitus. Alcohol Alcohol can cause sudden decreases in blood glucose (hypoglycemia), especially if you use insulin or take certain medicines for diabetes mellitus. Hypoglycemia can be a life-threatening condition. Symptoms of hypoglycemia (sleepiness, dizziness, and disorientation) are similar to symptoms of having too much alcohol.  If your health care provider has given you approval to drink alcohol, do so in moderation and use the following guidelines:  Women should not have more than one drink per day, and men should not have more than two drinks per day. One drink is equal to:  12 oz of beer.  5 oz of wine.  1 oz of hard liquor.  Do not drink on an empty stomach.  Keep yourself hydrated. Have water, diet  soda, or unsweetened iced tea.  Regular soda, juice, and other mixers might contain a lot of carbohydrates and should be counted. WHAT FOODS ARE NOT RECOMMENDED? As you make food choices, it is important to remember that all foods are not the same. Some foods have fewer nutrients per serving than other foods, even though they might have the same number of calories or carbohydrates. It is difficult to get your body what it needs when you eat foods with fewer nutrients. Examples of foods that you should avoid that are high in calories and carbohydrates but low in nutrients include:  Trans fats (most processed foods list trans fats on the Nutrition Facts label).  Regular soda.  Juice.  Candy.  Sweets, such as cake, pie, doughnuts, and cookies.  Fried foods. WHAT FOODS CAN I EAT? Eat nutrient-rich foods, which will nourish your body and keep you healthy. The food you should eat also will depend on several factors, including:  The calories you need.  The medicines you take.  Your weight.  Your blood glucose level.  Your  blood pressure level.  Your cholesterol level. You should eat a variety of foods, including:  Protein.  Lean cuts of meat.  Proteins low in saturated fats, such as fish, egg whites, and beans. Avoid processed meats.  Fruits and vegetables.  Fruits and vegetables that may help control blood glucose levels, such as apples, mangoes, and yams.  Dairy products.  Choose fat-free or low-fat dairy products, such as milk, yogurt, and cheese.  Grains, bread, pasta, and rice.  Choose whole grain products, such as multigrain bread, whole oats, and brown rice. These foods may help control blood pressure.  Fats.  Foods containing healthful fats, such as nuts, avocado, olive oil, canola oil, and fish. DOES EVERYONE WITH DIABETES MELLITUS HAVE THE SAME MEAL PLAN? Because every person with diabetes mellitus is different, there is not one meal plan that works for everyone. It is very important that you meet with a dietitian who will help you create a meal plan that is just right for you.   This information is not intended to replace advice given to you by your health care provider. Make sure you discuss any questions you have with your health care provider.   Document Released: 01/14/2005 Document Revised: 05/10/2014 Document Reviewed: 03/16/2013 Elsevier Interactive Patient Education [employer]

## 2015-09-18 IMAGING — US US ABDOMEN COMPLETE
1 series · 14 of 25 positions shown · non-contrast
Comparison: None.

CLINICAL DATA: Abdominal pain, nausea and vomiting

EXAM:
ULTRASOUND ABDOMEN COMPLETE

[Series 1: us abdomen complete · 0.27mm/px · 14 of 83 slices shown]
[im 1/83]
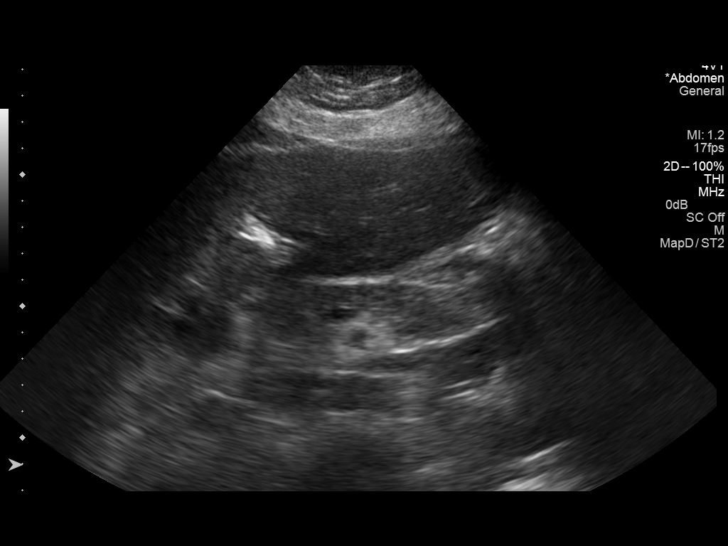
[im 7/83]
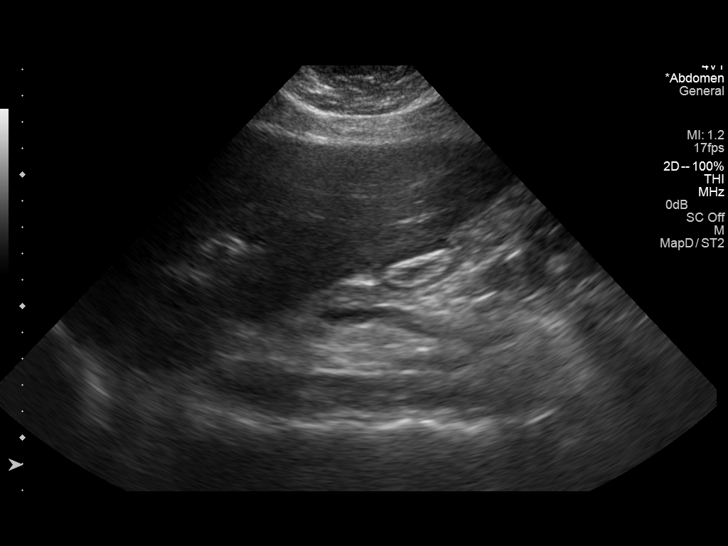
[im 14/83]
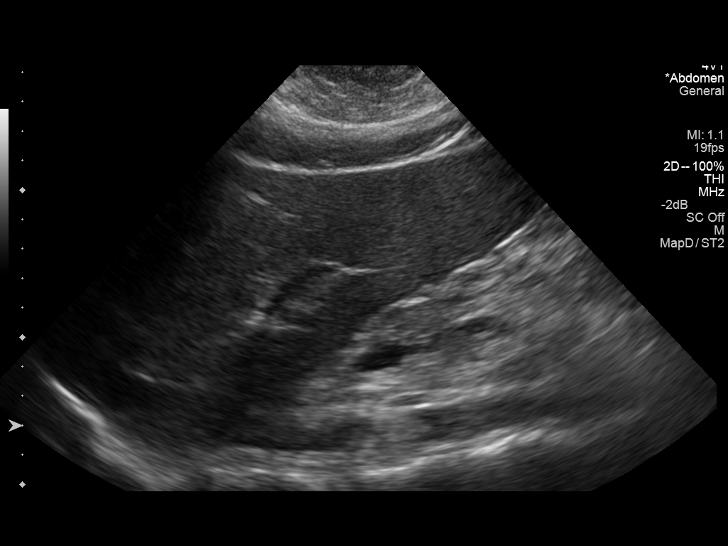
[im 21/83]
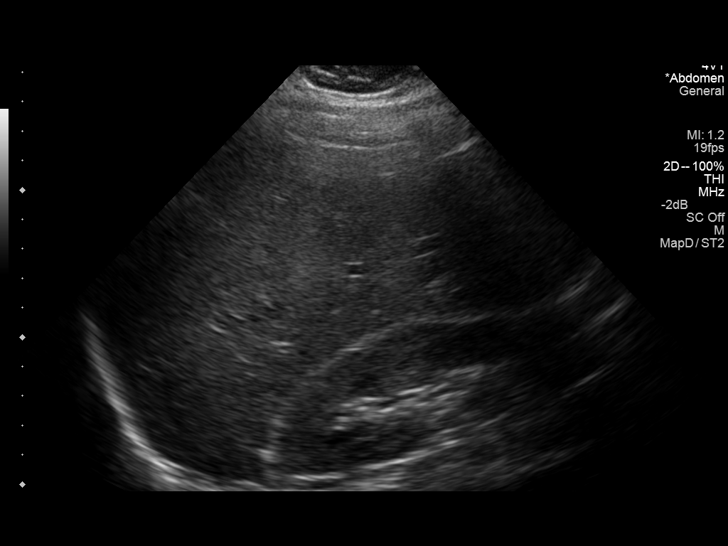
[im 28/83]
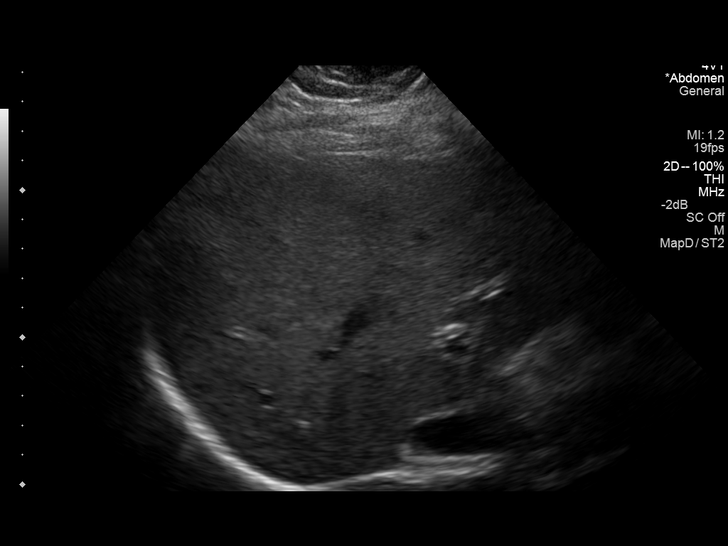
[im 31/83]
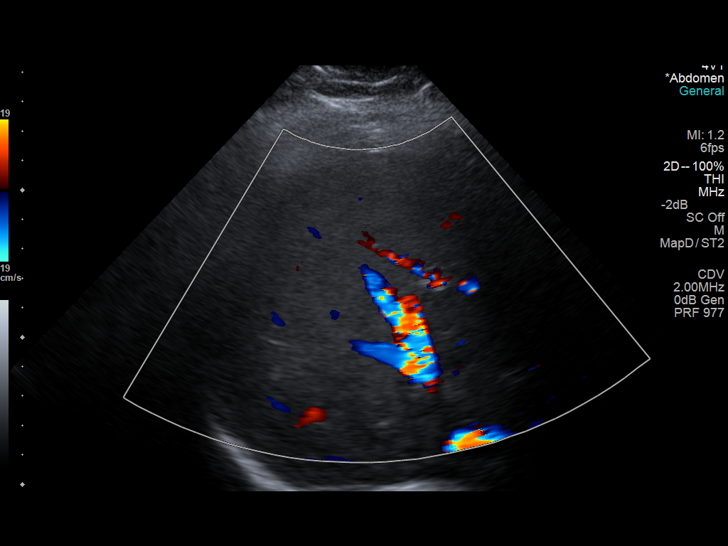
[im 38/83]
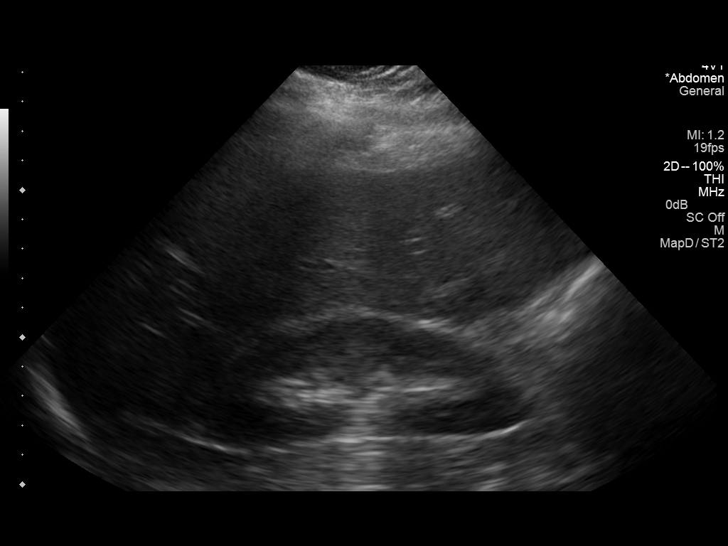
[im 45/83]
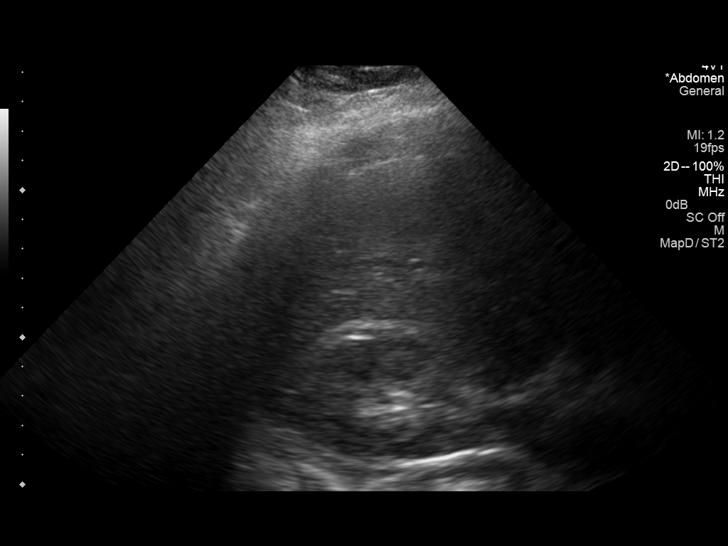
[im 52/83]
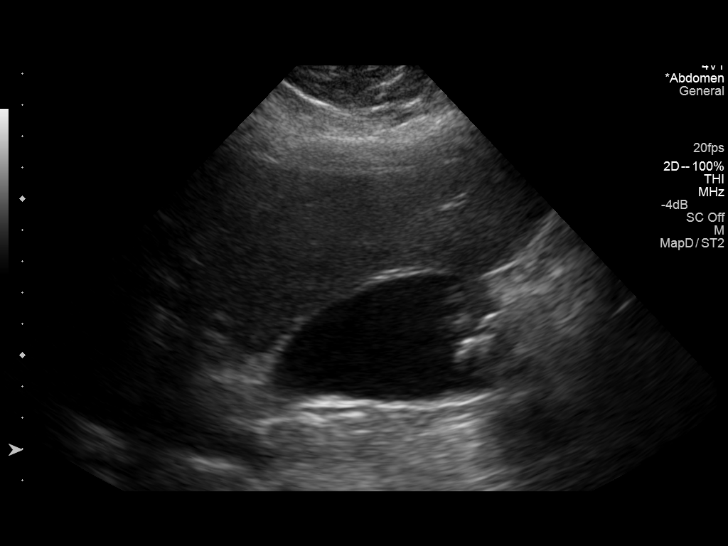
[im 55/83]
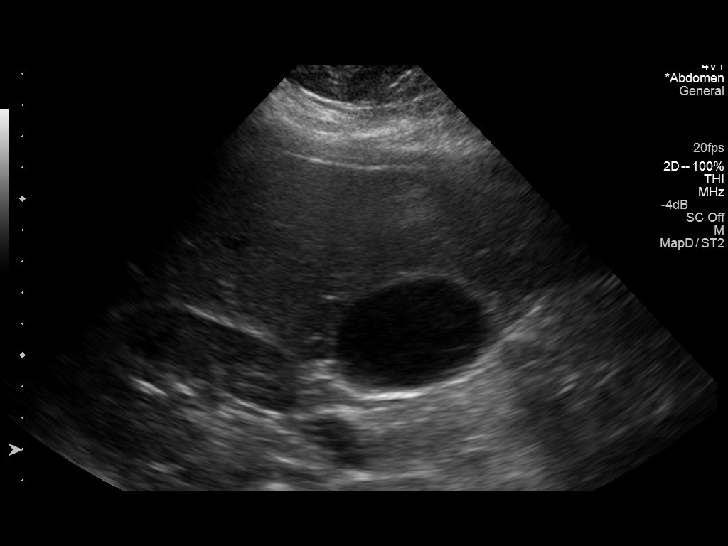
[im 62/83]
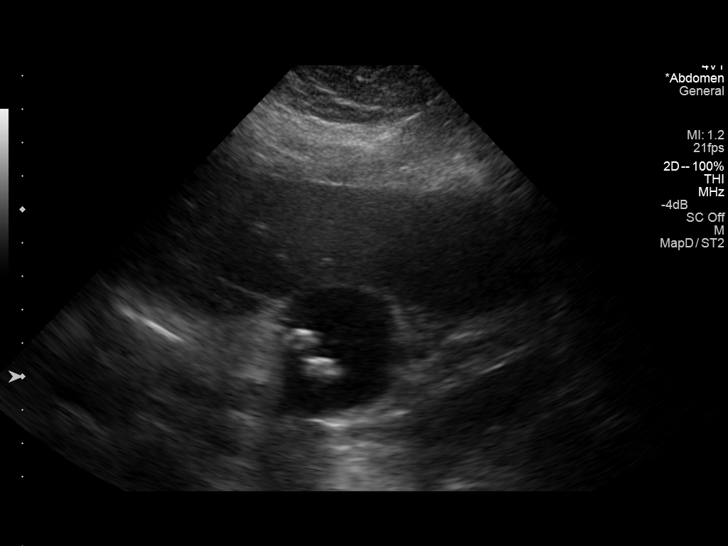
[im 69/83]
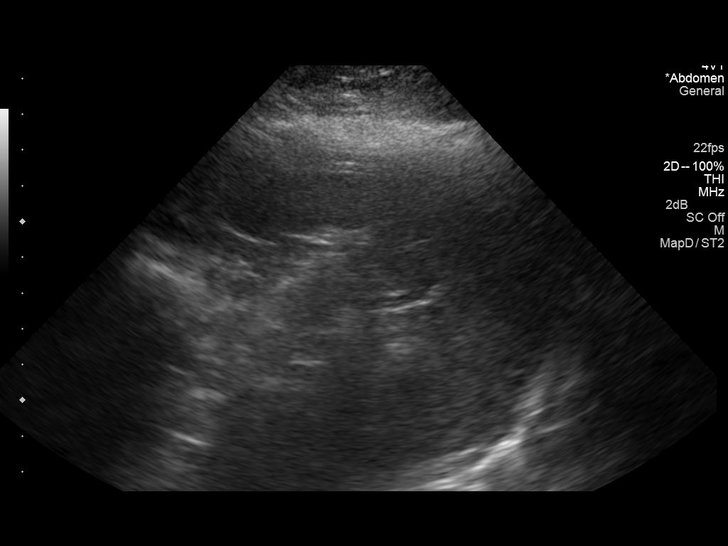
[im 76/83]
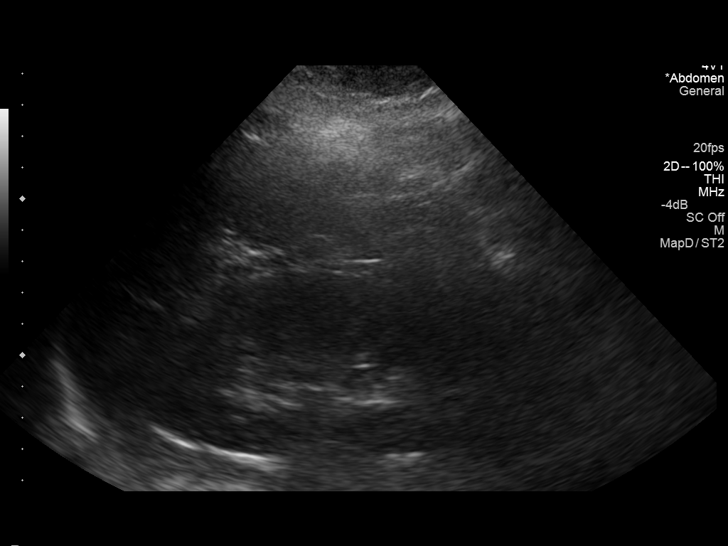
[im 83/83]
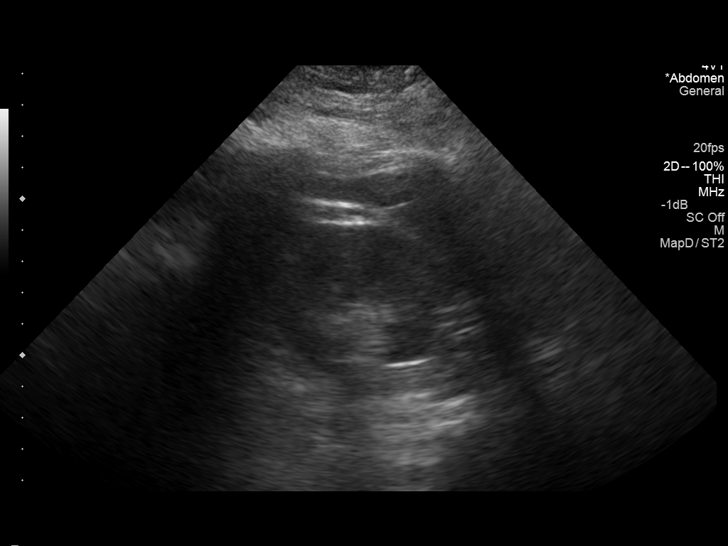

[14 of 25 positions shown; findings below may reference images not displayed]

FINDINGS: Gallbladder:

Multiple shadowing mobile gallstones are noted without gallbladder
wall thickening, pericholecystic fluid, or sonographic Murphy sign.

Common bile duct:

Diameter: 4 mm

Liver:

No focal lesion identified. Within normal limits in parenchymal
echogenicity.

IVC:

Normal in appearance.

Pancreas:

Visualized portion unremarkable.

Spleen:

Size and appearance within normal limits.

Right Kidney:

Length: 12.3 cm. Echogenicity within normal limits. No mass or
hydronephrosis visualized.

Left Kidney:

Length: 11.9 cm. Echogenicity within normal limits. No mass or
hydronephrosis visualized.

Abdominal aorta:

No proximal aneurysmal dilatation. Mid and distal aorta obscured by
bowel gas.

Other findings:

None.
IMPRESSION: Gallstones without other sonographic evidence for acute
cholecystitis. Normal exam otherwise without focal acute finding.
